# Patient Record
Sex: Female | Born: 1985 | Race: White | Hispanic: No | Marital: Married | Smoking: Former smoker
Health system: Southern US, Community
[De-identification: ages and names within clinical notes are randomized; demographics above are authoritative.]

## PROBLEM LIST (undated history)

## (undated) DIAGNOSIS — Z8619 Personal history of other infectious and parasitic diseases: Secondary | ICD-10-CM

## (undated) HISTORY — DX: Personal history of other infectious and parasitic diseases: Z86.19

## (undated) HISTORY — PX: NO PAST SURGERIES: SHX2092

---

## 2015-06-15 LAB — OB RESULTS CONSOLE ABO/RH: RH Type: POSITIVE

## 2015-06-15 LAB — OB RESULTS CONSOLE HIV ANTIBODY (ROUTINE TESTING): HIV: NONREACTIVE

## 2015-06-15 LAB — OB RESULTS CONSOLE ANTIBODY SCREEN: Antibody Screen: NEGATIVE

## 2015-06-15 LAB — OB RESULTS CONSOLE GC/CHLAMYDIA
Chlamydia: NEGATIVE
Gonorrhea: NEGATIVE

## 2015-06-15 LAB — OB RESULTS CONSOLE HEPATITIS B SURFACE ANTIGEN: HEP B S AG: NEGATIVE

## 2015-06-15 LAB — OB RESULTS CONSOLE RUBELLA ANTIBODY, IGM: RUBELLA: IMMUNE

## 2015-06-15 LAB — OB RESULTS CONSOLE RPR: RPR: NONREACTIVE

## 2015-12-31 LAB — OB RESULTS CONSOLE GBS: STREP GROUP B AG: NEGATIVE

## 2016-01-20 ENCOUNTER — Other Ambulatory Visit: Payer: Self-pay | Admitting: Obstetrics & Gynecology

## 2016-01-20 ENCOUNTER — Encounter (HOSPITAL_COMMUNITY): Payer: Self-pay | Admitting: *Deleted

## 2016-01-20 ENCOUNTER — Telehealth (HOSPITAL_COMMUNITY): Payer: Self-pay | Admitting: *Deleted

## 2016-01-20 NOTE — Telephone Encounter (Signed)
Preadmission screen  

## 2016-01-21 ENCOUNTER — Inpatient Hospital Stay (HOSPITAL_COMMUNITY): Payer: Medicaid Other | Admitting: Anesthesiology

## 2016-01-21 ENCOUNTER — Encounter (HOSPITAL_COMMUNITY)
Admission: RE | Disposition: A | Discharge status: Home / Self Care | Payer: Self-pay | Source: Ambulatory Visit | Attending: Obstetrics & Gynecology

## 2016-01-21 ENCOUNTER — Inpatient Hospital Stay (HOSPITAL_COMMUNITY)
Admission: RE | Admit: 2016-01-21 | Discharge: 2016-01-23 | DRG: 766 | Disposition: A | Discharge status: Home / Self Care | Payer: Medicaid Other | Source: Ambulatory Visit | Attending: Obstetrics & Gynecology | Admitting: Obstetrics & Gynecology

## 2016-01-21 ENCOUNTER — Encounter (HOSPITAL_COMMUNITY): Payer: Self-pay

## 2016-01-21 DIAGNOSIS — Y92231 Patient bathroom in hospital as the place of occurrence of the external cause: Secondary | ICD-10-CM | POA: Diagnosis not present

## 2016-01-21 DIAGNOSIS — O3660X0 Maternal care for excessive fetal growth, unspecified trimester, not applicable or unspecified: Secondary | ICD-10-CM | POA: Diagnosis present

## 2016-01-21 DIAGNOSIS — Z98891 History of uterine scar from previous surgery: Secondary | ICD-10-CM

## 2016-01-21 DIAGNOSIS — R55 Syncope and collapse: Secondary | ICD-10-CM | POA: Diagnosis not present

## 2016-01-21 DIAGNOSIS — W182XXA Fall in (into) shower or empty bathtub, initial encounter: Secondary | ICD-10-CM | POA: Diagnosis not present

## 2016-01-21 DIAGNOSIS — Y93F1 Activity, caregiving, bathing: Secondary | ICD-10-CM

## 2016-01-21 DIAGNOSIS — Z3A39 39 weeks gestation of pregnancy: Secondary | ICD-10-CM

## 2016-01-21 DIAGNOSIS — O3663X0 Maternal care for excessive fetal growth, third trimester, not applicable or unspecified: Principal | ICD-10-CM | POA: Diagnosis present

## 2016-01-21 DIAGNOSIS — Z87891 Personal history of nicotine dependence: Secondary | ICD-10-CM | POA: Diagnosis not present

## 2016-01-21 LAB — TYPE AND SCREEN
ABO/RH(D): O POS
ANTIBODY SCREEN: NEGATIVE

## 2016-01-21 LAB — CBC
HEMATOCRIT: 38 % (ref 36.0–46.0)
HEMOGLOBIN: 13.1 g/dL (ref 12.0–15.0)
MCH: 27.6 pg (ref 26.0–34.0)
MCHC: 34.5 g/dL (ref 30.0–36.0)
MCV: 80.2 fL (ref 78.0–100.0)
Platelets: 234 10*3/uL (ref 150–400)
RBC: 4.74 MIL/uL (ref 3.87–5.11)
RDW: 14.1 % (ref 11.5–15.5)
WBC: 13.7 10*3/uL — AB (ref 4.0–10.5)

## 2016-01-21 LAB — ABO/RH: ABO/RH(D): O POS

## 2016-01-21 SURGERY — Surgical Case
Anesthesia: Spinal

## 2016-01-21 MED ORDER — LIDOCAINE HCL (PF) 1 % IJ SOLN: 30.0000 mL | INTRAMUSCULAR | Status: DC | PRN

## 2016-01-21 MED ORDER — NALBUPHINE HCL 10 MG/ML IJ SOLN
5.0000 mg | INTRAMUSCULAR | Status: DC | PRN
  Administered 2016-01-21: 5 mg via INTRAVENOUS

## 2016-01-21 MED ORDER — PHENYLEPHRINE 8 MG IN D5W 100 ML (0.08MG/ML) PREMIX OPTIME
INJECTION | INTRAVENOUS | Status: DC | PRN
Start: 2016-01-21 — End: 2016-01-21
  Administered 2016-01-21: 60 ug/min via INTRAVENOUS

## 2016-01-21 MED ORDER — NALBUPHINE HCL 10 MG/ML IJ SOLN: 5.0000 mg | INTRAMUSCULAR | Status: DC | PRN

## 2016-01-21 MED ORDER — OXYCODONE-ACETAMINOPHEN 5-325 MG PO TABS
1.0000 | ORAL_TABLET | ORAL | Status: DC | PRN
  Administered 2016-01-22 – 2016-01-23 (×6): 1 via ORAL
  Filled 2016-01-21 (×6): qty 1

## 2016-01-21 MED ORDER — OXYTOCIN 40 UNITS IN LACTATED RINGERS INFUSION - SIMPLE MED
1.0000 m[IU]/min | INTRAVENOUS | Status: DC
  Administered 2016-01-21: 1 m[IU]/min via INTRAVENOUS

## 2016-01-21 MED ORDER — LACTATED RINGERS IV SOLN: 500.0000 mL | Freq: Once | INTRAVENOUS | Status: DC

## 2016-01-21 MED ORDER — CEFAZOLIN SODIUM-DEXTROSE 2-3 GM-% IV SOLR
INTRAVENOUS | Status: DC | PRN
  Administered 2016-01-21: 2 g via INTRAVENOUS

## 2016-01-21 MED ORDER — FENTANYL CITRATE (PF) 100 MCG/2ML IJ SOLN
50.0000 ug | INTRAMUSCULAR | Status: DC | PRN
Start: 2016-01-21 — End: 2016-01-21

## 2016-01-21 MED ORDER — OXYCODONE-ACETAMINOPHEN 5-325 MG PO TABS: 2.0000 | ORAL_TABLET | ORAL | Status: DC | PRN

## 2016-01-21 MED ORDER — LACTATED RINGERS IV SOLN
500.0000 mL | INTRAVENOUS | Status: DC | PRN
  Administered 2016-01-21: 500 mL via INTRAVENOUS

## 2016-01-21 MED ORDER — COCONUT OIL OIL: 1.0000 "application " | TOPICAL_OIL | Status: DC | PRN

## 2016-01-21 MED ORDER — FENTANYL CITRATE (PF) 100 MCG/2ML IJ SOLN
INTRAMUSCULAR | Status: DC | PRN
  Administered 2016-01-21: 10 ug via INTRATHECAL

## 2016-01-21 MED ORDER — DIPHENHYDRAMINE HCL 50 MG/ML IJ SOLN: 12.5000 mg | INTRAMUSCULAR | Status: DC | PRN

## 2016-01-21 MED ORDER — ACETAMINOPHEN 325 MG PO TABS
650.0000 mg | ORAL_TABLET | ORAL | Status: DC | PRN
  Administered 2016-01-21: 650 mg via ORAL
  Filled 2016-01-21: qty 2

## 2016-01-21 MED ORDER — FENTANYL 2.5 MCG/ML BUPIVACAINE 1/10 % EPIDURAL INFUSION (WH - ANES): 14.0000 mL/h | INTRAMUSCULAR | Status: DC | PRN

## 2016-01-21 MED ORDER — NALBUPHINE HCL 10 MG/ML IJ SOLN
INTRAMUSCULAR | Status: AC
  Filled 2016-01-21: qty 1

## 2016-01-21 MED ORDER — OXYTOCIN 40 UNITS IN LACTATED RINGERS INFUSION - SIMPLE MED: 1.0000 m[IU]/min | INTRAVENOUS | Status: DC

## 2016-01-21 MED ORDER — SENNOSIDES-DOCUSATE SODIUM 8.6-50 MG PO TABS
2.0000 | ORAL_TABLET | ORAL | Status: DC
  Administered 2016-01-21 – 2016-01-23 (×2): 2 via ORAL
  Filled 2016-01-21 (×2): qty 2

## 2016-01-21 MED ORDER — KETOROLAC TROMETHAMINE 30 MG/ML IJ SOLN
30.0000 mg | Freq: Four times a day (QID) | INTRAMUSCULAR | Status: DC | PRN
  Administered 2016-01-21: 30 mg via INTRAMUSCULAR

## 2016-01-21 MED ORDER — MORPHINE SULFATE (PF) 0.5 MG/ML IJ SOLN
INTRAMUSCULAR | Status: AC
  Filled 2016-01-21: qty 10

## 2016-01-21 MED ORDER — DIBUCAINE 1 % RE OINT: 1.0000 "application " | TOPICAL_OINTMENT | RECTAL | Status: DC | PRN

## 2016-01-21 MED ORDER — OXYTOCIN 40 UNITS IN LACTATED RINGERS INFUSION - SIMPLE MED: 2.5000 [IU]/h | INTRAVENOUS | Status: DC

## 2016-01-21 MED ORDER — NALBUPHINE HCL 10 MG/ML IJ SOLN: 5.0000 mg | Freq: Once | INTRAMUSCULAR | Status: DC | PRN

## 2016-01-21 MED ORDER — PHENYLEPHRINE 40 MCG/ML (10ML) SYRINGE FOR IV PUSH (FOR BLOOD PRESSURE SUPPORT): 80.0000 ug | PREFILLED_SYRINGE | INTRAVENOUS | Status: DC | PRN

## 2016-01-21 MED ORDER — OXYTOCIN BOLUS FROM INFUSION: 500.0000 mL | INTRAVENOUS | Status: DC

## 2016-01-21 MED ORDER — SOD CITRATE-CITRIC ACID 500-334 MG/5ML PO SOLN
30.0000 mL | ORAL | Status: DC | PRN
  Administered 2016-01-21: 30 mL via ORAL
  Filled 2016-01-21: qty 15

## 2016-01-21 MED ORDER — ONDANSETRON HCL 4 MG/2ML IJ SOLN: 4.0000 mg | Freq: Three times a day (TID) | INTRAMUSCULAR | Status: DC | PRN

## 2016-01-21 MED ORDER — MEPERIDINE HCL 25 MG/ML IJ SOLN: 6.2500 mg | INTRAMUSCULAR | Status: DC | PRN

## 2016-01-21 MED ORDER — METOCLOPRAMIDE HCL 5 MG/ML IJ SOLN
INTRAMUSCULAR | Status: AC
  Administered 2016-01-21: 10 mg via INTRAVENOUS
  Filled 2016-01-21: qty 2

## 2016-01-21 MED ORDER — FLEET ENEMA 7-19 GM/118ML RE ENEM: 1.0000 | ENEMA | RECTAL | Status: DC | PRN

## 2016-01-21 MED ORDER — SIMETHICONE 80 MG PO CHEW
80.0000 mg | CHEWABLE_TABLET | ORAL | Status: DC
  Administered 2016-01-21 – 2016-01-23 (×2): 80 mg via ORAL
  Filled 2016-01-21 (×2): qty 1

## 2016-01-21 MED ORDER — MORPHINE SULFATE (PF) 0.5 MG/ML IJ SOLN
INTRAMUSCULAR | Status: DC | PRN
  Administered 2016-01-21: .2 mg via INTRATHECAL

## 2016-01-21 MED ORDER — TERBUTALINE SULFATE 1 MG/ML IJ SOLN: 0.2500 mg | Freq: Once | INTRAMUSCULAR | Status: DC | PRN

## 2016-01-21 MED ORDER — SIMETHICONE 80 MG PO CHEW
80.0000 mg | CHEWABLE_TABLET | ORAL | Status: DC | PRN
  Administered 2016-01-21: 80 mg via ORAL
  Filled 2016-01-21: qty 1

## 2016-01-21 MED ORDER — OXYTOCIN 10 UNIT/ML IJ SOLN
40.0000 [IU] | INTRAVENOUS | Status: DC | PRN
  Administered 2016-01-21: 40 [IU] via INTRAVENOUS

## 2016-01-21 MED ORDER — PHENYLEPHRINE HCL 10 MG/ML IJ SOLN
INTRAMUSCULAR | Status: DC | PRN
  Administered 2016-01-21: 40 ug via INTRAVENOUS
  Administered 2016-01-21 (×2): 80 ug via INTRAVENOUS

## 2016-01-21 MED ORDER — PHENYLEPHRINE 8 MG IN D5W 100 ML (0.08MG/ML) PREMIX OPTIME
INJECTION | INTRAVENOUS | Status: AC
  Filled 2016-01-21: qty 100

## 2016-01-21 MED ORDER — ONDANSETRON HCL 4 MG/2ML IJ SOLN
INTRAMUSCULAR | Status: DC | PRN
  Administered 2016-01-21: 4 mg via INTRAVENOUS

## 2016-01-21 MED ORDER — KETOROLAC TROMETHAMINE 30 MG/ML IJ SOLN: 30.0000 mg | Freq: Four times a day (QID) | INTRAMUSCULAR | Status: DC | PRN

## 2016-01-21 MED ORDER — ZOLPIDEM TARTRATE 5 MG PO TABS: 5.0000 mg | ORAL_TABLET | Freq: Every evening | ORAL | Status: DC | PRN

## 2016-01-21 MED ORDER — MISOPROSTOL 25 MCG QUARTER TABLET
25.0000 ug | ORAL_TABLET | ORAL | Status: DC | PRN
  Filled 2016-01-21 (×2): qty 0.25

## 2016-01-21 MED ORDER — NALOXONE HCL 0.4 MG/ML IJ SOLN: 0.4000 mg | INTRAMUSCULAR | Status: DC | PRN

## 2016-01-21 MED ORDER — DIPHENHYDRAMINE HCL 25 MG PO CAPS
25.0000 mg | ORAL_CAPSULE | ORAL | Status: DC | PRN
  Filled 2016-01-21: qty 1

## 2016-01-21 MED ORDER — OXYTOCIN 40 UNITS IN LACTATED RINGERS INFUSION - SIMPLE MED: 2.5000 [IU]/h | INTRAVENOUS | Status: AC

## 2016-01-21 MED ORDER — PHENYLEPHRINE 40 MCG/ML (10ML) SYRINGE FOR IV PUSH (FOR BLOOD PRESSURE SUPPORT)
80.0000 ug | PREFILLED_SYRINGE | INTRAVENOUS | Status: DC | PRN
Start: 2016-01-21 — End: 2016-01-21

## 2016-01-21 MED ORDER — SIMETHICONE 80 MG PO CHEW
80.0000 mg | CHEWABLE_TABLET | Freq: Three times a day (TID) | ORAL | Status: DC
  Administered 2016-01-22 – 2016-01-23 (×5): 80 mg via ORAL
  Filled 2016-01-21 (×5): qty 1

## 2016-01-21 MED ORDER — TETANUS-DIPHTH-ACELL PERTUSSIS 5-2.5-18.5 LF-MCG/0.5 IM SUSP: 0.5000 mL | Freq: Once | INTRAMUSCULAR | Status: DC

## 2016-01-21 MED ORDER — SODIUM CHLORIDE 0.9% FLUSH: 3.0000 mL | INTRAVENOUS | Status: DC | PRN

## 2016-01-21 MED ORDER — FENTANYL CITRATE (PF) 100 MCG/2ML IJ SOLN
INTRAMUSCULAR | Status: AC
  Filled 2016-01-21: qty 2

## 2016-01-21 MED ORDER — ONDANSETRON HCL 4 MG/2ML IJ SOLN
INTRAMUSCULAR | Status: AC
  Filled 2016-01-21: qty 2

## 2016-01-21 MED ORDER — CEFAZOLIN SODIUM-DEXTROSE 2-4 GM/100ML-% IV SOLN
INTRAVENOUS | Status: AC
  Filled 2016-01-21: qty 100

## 2016-01-21 MED ORDER — EPHEDRINE 5 MG/ML INJ: 10.0000 mg | INTRAVENOUS | Status: DC | PRN

## 2016-01-21 MED ORDER — LACTATED RINGERS IV SOLN
INTRAVENOUS | Status: DC
  Administered 2016-01-21 – 2016-01-22 (×2): via INTRAVENOUS

## 2016-01-21 MED ORDER — KETOROLAC TROMETHAMINE 30 MG/ML IJ SOLN
INTRAMUSCULAR | Status: AC
  Administered 2016-01-21: 30 mg via INTRAMUSCULAR
  Filled 2016-01-21: qty 1

## 2016-01-21 MED ORDER — BUPIVACAINE IN DEXTROSE 0.75-8.25 % IT SOLN
INTRATHECAL | Status: DC | PRN
  Administered 2016-01-21: 12 mg via INTRATHECAL

## 2016-01-21 MED ORDER — SCOPOLAMINE 1 MG/3DAYS TD PT72
1.0000 | MEDICATED_PATCH | Freq: Once | TRANSDERMAL | Status: DC
  Administered 2016-01-21: 1.5 mg via TRANSDERMAL

## 2016-01-21 MED ORDER — LACTATED RINGERS IV SOLN
INTRAVENOUS | Status: DC
  Administered 2016-01-21: 01:00:00 via INTRAVENOUS
  Administered 2016-01-21: 125 mL/h via INTRAVENOUS
  Administered 2016-01-21 (×2): via INTRAVENOUS

## 2016-01-21 MED ORDER — DIPHENHYDRAMINE HCL 25 MG PO CAPS
25.0000 mg | ORAL_CAPSULE | Freq: Four times a day (QID) | ORAL | Status: DC | PRN
  Administered 2016-01-21: 25 mg via ORAL
  Filled 2016-01-21 (×2): qty 1

## 2016-01-21 MED ORDER — METOCLOPRAMIDE HCL 5 MG/ML IJ SOLN
10.0000 mg | Freq: Once | INTRAMUSCULAR | Status: AC
  Administered 2016-01-21: 10 mg via INTRAVENOUS

## 2016-01-21 MED ORDER — ONDANSETRON HCL 4 MG/2ML IJ SOLN: 4.0000 mg | Freq: Four times a day (QID) | INTRAMUSCULAR | Status: DC | PRN

## 2016-01-21 MED ORDER — OXYTOCIN 40 UNITS IN LACTATED RINGERS INFUSION - SIMPLE MED
1.0000 m[IU]/min | INTRAVENOUS | Status: DC
  Filled 2016-01-21: qty 1000

## 2016-01-21 MED ORDER — SCOPOLAMINE 1 MG/3DAYS TD PT72
MEDICATED_PATCH | TRANSDERMAL | Status: AC
  Filled 2016-01-21: qty 1

## 2016-01-21 MED ORDER — MENTHOL 3 MG MT LOZG: 1.0000 | LOZENGE | OROMUCOSAL | Status: DC | PRN

## 2016-01-21 MED ORDER — OXYTOCIN 10 UNIT/ML IJ SOLN
INTRAMUSCULAR | Status: AC
  Filled 2016-01-21: qty 4

## 2016-01-21 MED ORDER — NALOXONE HCL 2 MG/2ML IJ SOSY
1.0000 ug/kg/h | PREFILLED_SYRINGE | INTRAVENOUS | Status: DC | PRN
  Filled 2016-01-21: qty 2

## 2016-01-21 MED ORDER — PRENATAL MULTIVITAMIN CH
1.0000 | ORAL_TABLET | Freq: Every day | ORAL | Status: DC
  Administered 2016-01-22 – 2016-01-23 (×2): 1 via ORAL
  Filled 2016-01-21 (×2): qty 1

## 2016-01-21 MED ORDER — ACETAMINOPHEN 325 MG PO TABS: 650.0000 mg | ORAL_TABLET | ORAL | Status: DC | PRN

## 2016-01-21 MED ORDER — WITCH HAZEL-GLYCERIN EX PADS: 1.0000 "application " | MEDICATED_PAD | CUTANEOUS | Status: DC | PRN

## 2016-01-21 MED ORDER — LACTATED RINGERS IV SOLN
INTRAVENOUS | Status: DC | PRN
  Administered 2016-01-21: 13:00:00 via INTRAVENOUS

## 2016-01-21 MED ORDER — IBUPROFEN 600 MG PO TABS
600.0000 mg | ORAL_TABLET | Freq: Four times a day (QID) | ORAL | Status: DC
  Administered 2016-01-21 – 2016-01-23 (×8): 600 mg via ORAL
  Filled 2016-01-21 (×8): qty 1

## 2016-01-21 SURGICAL SUPPLY — 40 items
BENZOIN TINCTURE PRP APPL 2/3 (GAUZE/BANDAGES/DRESSINGS) ×3 IMPLANT
CLAMP CORD UMBIL (MISCELLANEOUS) IMPLANT
CLOSURE WOUND 1/2 X4 (GAUZE/BANDAGES/DRESSINGS) ×1
CLOTH BEACON ORANGE TIMEOUT ST (SAFETY) ×3 IMPLANT
CONTAINER PREFILL 10% NBF 15ML (MISCELLANEOUS) IMPLANT
DRSG OPSITE POSTOP 4X10 (GAUZE/BANDAGES/DRESSINGS) ×3 IMPLANT
DURAPREP 26ML APPLICATOR (WOUND CARE) ×3 IMPLANT
ELECT REM PT RETURN 9FT ADLT (ELECTROSURGICAL) ×3
ELECTRODE REM PT RTRN 9FT ADLT (ELECTROSURGICAL) ×1 IMPLANT
EXTRACTOR VACUUM M CUP 4 TUBE (SUCTIONS) IMPLANT
EXTRACTOR VACUUM M CUP 4' TUBE (SUCTIONS)
GLOVE BIO SURGEON STRL SZ7.5 (GLOVE) ×3 IMPLANT
GLOVE BIOGEL PI IND STRL 7.0 (GLOVE) ×1 IMPLANT
GLOVE BIOGEL PI IND STRL 7.5 (GLOVE) ×1 IMPLANT
GLOVE BIOGEL PI INDICATOR 7.0 (GLOVE) ×2
GLOVE BIOGEL PI INDICATOR 7.5 (GLOVE) ×2
GOWN STRL REUS W/TWL LRG LVL3 (GOWN DISPOSABLE) ×6 IMPLANT
KIT ABG SYR 3ML LUER SLIP (SYRINGE) IMPLANT
NEEDLE HYPO 25X5/8 SAFETYGLIDE (NEEDLE) IMPLANT
NS IRRIG 1000ML POUR BTL (IV SOLUTION) ×3 IMPLANT
PACK C SECTION WH (CUSTOM PROCEDURE TRAY) ×3 IMPLANT
PAD ABD 7.5X8 STRL (GAUZE/BANDAGES/DRESSINGS) ×3 IMPLANT
PAD ABD 8X10 STRL (GAUZE/BANDAGES/DRESSINGS) ×3 IMPLANT
PAD OB MATERNITY 4.3X12.25 (PERSONAL CARE ITEMS) ×3 IMPLANT
PENCIL SMOKE EVAC W/HOLSTER (ELECTROSURGICAL) ×3 IMPLANT
RTRCTR C-SECT PINK 25CM LRG (MISCELLANEOUS) ×3 IMPLANT
SPONGE GAUZE 4X4 12PLY (GAUZE/BANDAGES/DRESSINGS) ×3 IMPLANT
SPONGE GAUZE 4X4 12PLY STER LF (GAUZE/BANDAGES/DRESSINGS) ×6 IMPLANT
SPONGE LAP 18X18 X RAY DECT (DISPOSABLE) ×6 IMPLANT
STRIP CLOSURE SKIN 1/2X4 (GAUZE/BANDAGES/DRESSINGS) ×2 IMPLANT
SUT CHROMIC 2 0 CT 1 (SUTURE) ×3 IMPLANT
SUT MNCRL AB 3-0 PS2 27 (SUTURE) ×3 IMPLANT
SUT PLAIN 2 0 XLH (SUTURE) ×6 IMPLANT
SUT VIC AB 0 CT1 36 (SUTURE) ×3 IMPLANT
SUT VIC AB 0 CTX 36 (SUTURE) ×6
SUT VIC AB 0 CTX36XBRD ANBCTRL (SUTURE) ×3 IMPLANT
SUT VIC AB 2-0 SH 27 (SUTURE) ×4
SUT VIC AB 2-0 SH 27XBRD (SUTURE) ×2 IMPLANT
TOWEL OR 17X24 6PK STRL BLUE (TOWEL DISPOSABLE) ×3 IMPLANT
TRAY FOLEY CATH SILVER 14FR (SET/KITS/TRAYS/PACK) ×3 IMPLANT

## 2016-01-21 NOTE — Anesthesia Pain Management Evaluation Note (Signed)
  CRNA Pain Management Visit Note  Patient: Samantha Durham, 30 y.o., female  "Hello I am a member of the anesthesia team at [facility]. We have an anesthesia team available at all times to provide care throughout the hospital, including epidural management and anesthesia for C-section. I don't know your plan for the delivery whether it a natural birth, water birth, IV sedation, nitrous supplementation, doula or epidural, but we want to meet your pain goals."   1.Was your pain managed to your expectations on prior hospitalizations?   epidural  2.What is your expectation for pain management during this hospitalization?     epidural  3.How can we help you reach that goal? epidural  Record the patient's initial score and the patient's pain goal.   Pain: 2/10  Pain Goal: 2/10 [facility] wants you to be able to say your pain was always managed very well.  Samantha Durham, Samantha Durham 01/21/2016

## 2016-01-21 NOTE — Progress Notes (Signed)
Samantha Durham is a 30 y.o. G3P1011 at 6844w3d admitted for elective induction with a history of shoulder dystocia and 10-3 baby  Subjective:  Comfortable with  Labor support without medications Contractions every 2-3 minutes, lasting 60 seconds, intensity 4/10    Objective: BP 97/60 mmHg  Pulse 96  Temp(Src) 98.5 F (36.9 C) (Oral)  Resp 18  Ht 5\' 6"  (1.676 m)  Wt 214 lb (97.07 kg)  BMI 34.56 kg/m2  LMP 04/20/2015      FHT: Category 1, variability: moderate,  accelerations:  Present,  decelerations:  Absent SVE:   Dilation: 1 Effacement (%): 50 Station: -3 Exam by:: Clemmons  Labs: Lab Results  Component Value Date   WBC 13.7* 01/21/2016   HGB 13.1 01/21/2016   HCT 38.0 01/21/2016   MCV 80.2 01/21/2016   PLT 234 01/21/2016    Assessment / Plan: IUP @ 39+3 Elective induction Pitocin Fetal Wellbeing: reassuring Anticipated MOD:  NSVD  Lori A Clemmons 01/21/2016, 7:31 AM

## 2016-01-21 NOTE — Anesthesia Procedure Notes (Signed)
Spinal  Staffing Anesthesiologist: Marcene DuosFITZGERALD, Avary Eichenberger Performed by: anesthesiologist  Preanesthetic Checklist Completed: patient identified, site marked, surgical consent, pre-op evaluation, timeout performed, IV checked, risks and benefits discussed and monitors and equipment checked Spinal Block Patient position: sitting Prep: site prepped and draped and DuraPrep Patient monitoring: heart rate, continuous pulse ox and blood pressure Approach: midline Location: L4-5 Injection technique: single-shot Needle Needle type: Pencan  Needle gauge: 24 G Needle length: 9 cm Assessment Sensory level: T6

## 2016-01-21 NOTE — Op Note (Signed)
Cesarean Section Procedure Note  Indications: P1 at 3439 3/7wks with LGA and h/o c-section electing for c-section.  Pre-operative Diagnosis: cesarean section - LGA, hx shoulder dystocia   Post-operative Diagnosis: cesarean section - LGA, hx shoulder dystocia  Procedure: PRIMARY LOW TRANSVERSE CESAREAN SECTION  Surgeon: Osborn CohoAngela Rabia Argote, MD    Assistants: Webb SilversmithHeather Krietemeyer, RNFA  Anesthesia: Regional  Procedure Details  The patient was taken [facility] the operating [facility] after the risks, benefits, complications, treatment options, and expected outcomes were discussed with the patient.  The patient concurred with the proposed plan, giving informed consent which was signed and witnessed. The patient was taken to Operating Room C-Section Suite, identified as Samantha Arvilla MarketMichelle Durham and the procedure verified as C-Section Delivery. A Time Out was held and the above information confirmed.  After induction of anesthesia by obtaining a spinal, the patient was prepped and draped in the usual sterile manner. A Pfannenstiel skin incision was made and carried down through the subcutaneous tissue to the underlying layer of fascia.  The fascia was incised bilaterally and extended transversely bilaterally with the Mayo scissors. Kocher clamps were placed on the inferior aspect of the fascial incision and the underlying rectus muscle was separated from the fascia. The same was done on the superior aspect of the fascial incision.  The peritoneum was identified, entered bluntly and extended manually.  An Alexis self-retaining retractor was placed.  The utero-vesical peritoneal reflection was incised transversely and the bladder flap was bluntly freed from the lower uterine segment. A low transverse uterine incision was made with the scalpel and extended bilaterally with the bandage scissors.  The infant was delivered in vertex position without difficulty.  After the umbilical cord was  clamped and cut, the infant was handed to the awaiting pediatricians.  Cord blood was obtained for evaluation.  The placenta was removed intact and appeared to be within normal limits. The uterus was cleared of all clots and debris. The uterine incision was closed with running interlocking sutures of 0 Vicryl and a second imbricating layer was performed as well.   Bilateral tubes and ovaries appeared to be within normal limits.  Good hemostasis was noted.  Copious irrigation was performed until clear.  The peritoneum was repaired with 2-0 chromic via a running suture.  The fascia was reapproximated with a running suture of 0 Vicryl. The subcutaneous tissue was reapproximated with 3 interrupted sutures of 2-0 plain.  The skin was reapproximated with a subcuticular suture of 3-0 monocryl.  Steristrips were applied.  Instrument, sponge, and needle counts were correct prior to abdominal closure and at the conclusion of the case.  The patient was awaiting transfer to the recovery room in good condition.  Findings: Live female infant with Apgars 9 at one minute and 9 at five minutes.  Normal appearing bilateral ovaries and fallopian tubes were noted.  Estimated Blood Loss:  1300 ml         Drains: foley to gravity          Total IV Fluids: 3300 ml         Specimens to Pathology: Placenta         Complications:  None; patient tolerated the procedure well.         Disposition: PACU - hemodynamically stable.         Condition: stable  Attending Attestation: I performed the procedure.

## 2016-01-21 NOTE — H&P (Signed)
Samantha Durham is a 30 y.o. female presenting for elective induction of labor; Current pregnancy- LGA 94.2% 9 lbs 2 oz. In prior pregnancy there was a  previous shoulder dystocia with a 10 lb 3 oz baby.   Pregnancy followed at [facility] since 8 weeks and remarkable for: history of a 10 lb 3 oz baby with difficult birth and lga with current pregnancy    OB History    Gravida Para Term Preterm AB TAB SAB Ectopic Multiple Living   3 1 1  1  1   1      Past Medical History  Diagnosis Date  . Hx of varicella    Past Surgical History  Procedure Laterality Date  . No past surgeries      Family History:   family history is not on file. Social History:    reports that she quit smoking about 4 years ago. She has never used smokeless tobacco. She reports that she does not drink alcohol or use illicit drugs.   Prenatal labs: ABO, Rh: O/Positive/-- (11/22 0000) Antibody: Negative (11/22 0000) Rubella: !Error!Immune RPR: Nonreactive (11/22 0000)  HBsAg: Negative (11/22 0000)  HIV: Non-reactive (11/22 0000)  GBS: Negative (06/09 0000)    Prenatal Transfer Tool  Maternal Diabetes: No Genetic Screening: Normal Maternal Ultrasounds/Referrals: Normal Fetal Ultrasounds or other Referrals:  None Maternal Substance Abuse:  No Significant Maternal Medications:  None Significant Maternal Lab Results: None   Dilation: 1 Effacement (%): 50 Station: -3 Exam by:: S Moyer Blood pressure 117/60, pulse 100, temperature 98.7 F (37.1 C), temperature source Oral, resp. rate 16, height 5\' 6"  (1.676 m), weight 214 lb (97.07 kg), last menstrual period 04/20/2015.  General Appearance: Alert, appropriate appearance for age. No acute distress HEENT Exam: Grossly normal Chest/Respiratory Exam: Normal chest wall and respirations. Clear to auscultation Cardiovascular Exam: Regular rate and rhythm. S1, S2, no murmur Gastrointestinal Exam: soft, non-tender, Uterus gravid with size compatible with  GA, Vertex presentation by Leopold's maneuvers Psychiatric Exam: Alert and oriented, appropriate affect    Vaginal exam: Dilation: 1 Effacement (%): 50 Cervical Position: Anterior Station: -3 Presentation: Vertex Exam by:: Clemmons  Fetal tracings: Category 1 , reassuring,  Assessment/Plan:  IUP @ 39+3 Elective Induction of labor; Hx of 10 lb 3 oz baby with shoulder dystocia GBS negative  Low dose pitocin Will place foley Bulb when cervix is in better position Anticipate vaginal delivery    Illene BolusLori Clemmons CNM  01/21/2016, 2:13 AM  I agree with above findings, assessment and plan.  At the office all delivery options were discussed with the patient given her history of LGA baby and shoulder dystocia in last pregnancy.  Options of expectant management and awaiting spontaneous labor, elective labor induction and cesarean delivery were discussed.  Patient desired an elective induction of labor.  We discussed induction plan at the office as well as risks of labor induction that include higher risk of cesarean delivery.  We discussed potential for repeat shoulder dystocia and what the management plan would be in that situation.  All questions were answered and patient agreed to come in for an elective induction.  Dr. Sallye OberKulwa.

## 2016-01-21 NOTE — Transfer of Care (Signed)
Immediate Anesthesia Transfer of Care Note  Patient: Samantha Durham  Procedure(s) Performed: Procedure(s): CESAREAN SECTION (N/A)  Patient Location: PACU  Anesthesia Type:Spinal  Level of Consciousness: awake  Airway & Oxygen Therapy: Patient Spontanous Breathing  Post-op Assessment: Report given to RN  Post vital signs: Reviewed and stable  Last Vitals:  Filed Vitals:   01/21/16 1130 01/21/16 1200  BP: 111/70 98/57  Pulse: 87 93  Temp:    Resp: 16 16    Last Pain:  Filed Vitals:   01/21/16 1223  PainSc: 0-No pain      Patients Stated Pain Goal: 8 (01/21/16 0057)  Complications: No apparent anesthesia complications

## 2016-01-21 NOTE — Progress Notes (Signed)
Samantha Durham is a 30 y.o. G3P1011 at 703w3d admitted for induction of labor due to term and h/o 10lb 3oz baby with SD.  Subjective: No complaints  Objective: BP 99/66 mmHg  Pulse 85  Temp(Src) 98 F (36.7 C) (Oral)  Resp 16  Ht 5\' 6"  (1.676 m)  Wt 214 lb (97.07 kg)  BMI 34.56 kg/m2  LMP 04/20/2015      FHT:  FHR: 130s bpm, variability: moderate,  accelerations:  Present,  decelerations:  Absent UC:   regular, every 2-3 minutes SVE:   Dilation: 1 Effacement (%): 50 Station: -3 Exam by:: Clemmons  Labs: Lab Results  Component Value Date   WBC 13.7* 01/21/2016   HGB 13.1 01/21/2016   HCT 38.0 01/21/2016   MCV 80.2 01/21/2016   PLT 234 01/21/2016    Assessment / Plan: Induction of labor,  progressing well on pitocin  Labor: Progressing on Pitocin, will continue to increase then AROM Preeclampsia:  no signs or symptoms of toxicity Fetal Wellbeing:  Category I Pain Control:  pain medicine upon request I/D:  GBS neg Anticipated MOD:  NSVD  Samantha Durham Y 01/21/2016, 11:04 AM

## 2016-01-21 NOTE — Anesthesia Postprocedure Evaluation (Signed)
Anesthesia Post Note  Patient: Samantha Durham  Procedure(s) Performed: Procedure(s) (LRB): CESAREAN SECTION (N/A)  Patient location during evaluation: PACU Anesthesia Type: Spinal Level of consciousness: awake and alert Pain management: pain level controlled Vital Signs Assessment: post-procedure vital signs reviewed and stable Respiratory status: spontaneous breathing and respiratory function stable Cardiovascular status: blood pressure returned to baseline and stable Postop Assessment: spinal receding Anesthetic complications: no     Last Vitals:  Filed Vitals:   01/21/16 1516 01/21/16 1535  BP:  90/51  Pulse: 81 73  Temp:  36.3 C  Resp: 18 18    Last Pain:  Filed Vitals:   01/21/16 1546  PainSc: 0-No pain   Pain Goal: Patients Stated Pain Goal: 8 (01/21/16 0057)               Kennieth RadFitzgerald, Junius Faucett E

## 2016-01-21 NOTE — Anesthesia Preprocedure Evaluation (Addendum)
Anesthesia Evaluation  Patient identified by MRN, date of birth, ID band Patient awake    Reviewed: Allergy & Precautions, NPO status , Patient's Chart, lab work & pertinent test results  Airway Mallampati: II  TM Distance: >3 FB Neck ROM: Full    Dental   Pulmonary former smoker,    Pulmonary exam normal        Cardiovascular negative cardio ROS Normal cardiovascular exam     Neuro/Psych negative neurological ROS     GI/Hepatic negative GI ROS, Neg liver ROS,   Endo/Other  negative endocrine ROS  Renal/GU negative Renal ROS     Musculoskeletal   Abdominal   Peds  Hematology negative hematology ROS (+)   Anesthesia Other Findings   Reproductive/Obstetrics (+) Pregnancy                            Lab Results  Component Value Date   WBC 13.7* 01/21/2016   HGB 13.1 01/21/2016   HCT 38.0 01/21/2016   MCV 80.2 01/21/2016   PLT 234 01/21/2016   No results found for: CREATININE, BUN, NA, K, CL, CO2  Anesthesia Physical Anesthesia Plan  ASA: II  Anesthesia Plan: Spinal   Post-op Pain Management:    Induction:   Airway Management Planned: Natural Airway  Additional Equipment:   Intra-op Plan:   Post-operative Plan:   Informed Consent: I have reviewed the patients History and Physical, chart, labs and discussed the procedure including the risks, benefits and alternatives for the proposed anesthesia with the patient or authorized representative who has indicated his/her understanding and acceptance.     Plan Discussed with: CRNA  Anesthesia Plan Comments:         Anesthesia Quick Evaluation

## 2016-01-21 NOTE — Progress Notes (Signed)
Jason Arvilla MarketMichelle Bartell is a 30 y.o. G3P1011 at 5578w3d admitted for LGA and hx of shoulder dystocia with 10 lb 3 oz baby. Pt is having uncomfortable contractions when admitted.  Subjective:  Comfortable with  Labor support without medications Contractions every 2-3 minutes, lasting 60 seconds, intensity 4/10    Objective: BP 117/60 mmHg  Pulse 100  Temp(Src) 98.7 F (37.1 C) (Oral)  Resp 16  Ht 5\' 6"  (1.676 m)  Wt 214 lb (97.07 kg)  BMI 34.56 kg/m2  LMP 04/20/2015      FHT:  FHR:  bpm, variability: moderate,  accelerations:  Present,  decelerations:  Absent SVE:   Dilation: 1 Effacement (%): 50 Station: -3 Exam by:: Clemmons  Labs: Lab Results  Component Value Date   WBC 13.7* 01/21/2016   HGB 13.1 01/21/2016   HCT 38.0 01/21/2016   MCV 80.2 01/21/2016   PLT 234 01/21/2016    Assessment / Plan: Induction of labor; Unable to place cytotec due to contractions; unable to place foley bulb due to position of anterior cervix; Will start low dose pitocin Fetal Wellbeing: reassuring Anticipated MOD:  NSVD  Lori A Clemmons 01/21/2016, 3:09 AM

## 2016-01-21 NOTE — Progress Notes (Addendum)
Samantha Durham is a 30 y.o. G3P1011 at 2625w3d admitted for Induction  Subjective: Pt is very concerned that the exam has not changed and head is high.  She was anticipating a c-section most of pregnancy until ultrasound a couple of days ago and baby was 9lbs 3oz.  Pt is interested in c-section again.  Objective: BP 98/57 mmHg  Pulse 93  Temp(Src) 98 F (36.7 C) (Oral)  Resp 16  Ht 5\' 6"  (1.676 m)  Wt 214 lb (97.07 kg)  BMI 34.56 kg/m2  LMP 04/20/2015      FHT:  FHR: 140s bpm, variability: moderate,  accelerations:  Present,  decelerations:  Absent UC:   regular, every 2-3 minutes SVE:   Dilation: 2.5 Effacement (%): 50 Station: -3 Exam by:: MD Su Hiltoberts  Labs: Lab Results  Component Value Date   WBC 13.7* 01/21/2016   HGB 13.1 01/21/2016   HCT 38.0 01/21/2016   MCV 80.2 01/21/2016   PLT 234 01/21/2016    Assessment / Plan: h/o Shoulder Dystocia and currently not progressing.  Lengthy discussion with patient and her husband explaining options, risks, benefits and alternatives and patient would like to proceed with c-section.  R/B/A reviewed, questions answered and consent s/w.  Labor: proceeding with c-section Preeclampsia:  no signs or symptoms of toxicity Fetal Wellbeing:  Category I Pain Control:  per anesthesia for c-section I/D:  GBS neg Anticipated MOD:  c-section secondary to LGA  Jabir Dahlem Y 01/21/2016, 12:38 PM

## 2016-01-22 ENCOUNTER — Encounter (HOSPITAL_COMMUNITY): Payer: Self-pay | Admitting: Obstetrics and Gynecology

## 2016-01-22 DIAGNOSIS — Z98891 History of uterine scar from previous surgery: Secondary | ICD-10-CM

## 2016-01-22 LAB — CBC
HCT: 30 % — ABNORMAL LOW (ref 36.0–46.0)
HEMATOCRIT: 30.4 % — AB (ref 36.0–46.0)
HEMOGLOBIN: 10.2 g/dL — AB (ref 12.0–15.0)
HEMOGLOBIN: 10.1 g/dL — AB (ref 12.0–15.0)
MCH: 27.8 pg (ref 26.0–34.0)
MCH: 27.8 pg (ref 26.0–34.0)
MCHC: 33.9 g/dL (ref 30.0–36.0)
MCHC: 33.7 g/dL (ref 30.0–36.0)
MCV: 82.2 fL (ref 78.0–100.0)
MCV: 82.6 fL (ref 78.0–100.0)
PLATELETS: 209 10*3/uL (ref 150–400)
PLATELETS: 225 10*3/uL (ref 150–400)
RBC: 3.7 MIL/uL — AB (ref 3.87–5.11)
RBC: 3.63 MIL/uL — ABNORMAL LOW (ref 3.87–5.11)
RDW: 14.3 % (ref 11.5–15.5)
RDW: 14.5 % (ref 11.5–15.5)
WBC: 13 10*3/uL — AB (ref 4.0–10.5)
WBC: 11.3 10*3/uL — ABNORMAL HIGH (ref 4.0–10.5)

## 2016-01-22 LAB — RPR: RPR Ser Ql: NONREACTIVE

## 2016-01-22 NOTE — Progress Notes (Signed)
In to discuss circumcision.  Pt comfortable.  Has not ambulated much yet.  +Flatus.  VSS Gen: NAD Abd:  Pressure Dressing clean and dry Lochia min-moderate blood on pad Ext:  Mild edema, no calf tenderness  A/p S/p Repeat LTCS Doing well post op. After counseling, pt will likely have circumcision outpatient. Continue routine post op care. Encouraged ambulation TID.

## 2016-01-22 NOTE — Addendum Note (Signed)
Addendum  created 01/22/16 0909 by Graciela HusbandsWynn O Luverne Farone, CRNA   Modules edited: Notes Section   Notes Section:  File: [id number]

## 2016-01-22 NOTE — Progress Notes (Addendum)
   01/22/16 1600  What Happened  Was fall witnessed? No  Was patient injured? No  Patient found on floor;in bathroom  Found by Staff-comment  Stated prior activity bathroom-unassisted (on toilet, syncopal episode)  Follow Up  MD notified 1558  Time MD notified [id number]  Family notified Yes-comment (husband called from room)  Time family notified 1550  Additional tests Yes-comment (cbc)  Simple treatment Other (comment) (return to bed)  Adult Fall Risk Assessment  Risk Factor Category (scoring not indicated) High fall risk per protocol (document High fall risk)  Age 30  Fall History: Fall within 6 months prior to admission 0  Elimination; Bowel and/or Urine Incontinence 0  Elimination; Bowel and/or Urine Urgency/Frequency 0  Medications: includes PCA/Opiates, Anti-convulsants, Anti-hypertensives, Diuretics, Hypnotics, Laxatives, Sedatives, and Psychotropics 3  Patient Care Equipment 0  Mobility-Assistance 2 (previously moved well without assistance)  Mobility-Gait 0  Mobility-Sensory Deficit 0  Altered awareness of immediate physical environment 0  Impulsiveness 0  Lack of understanding of one's physical/cognitive limitations 0  Total Score 5  Patient's Fall Risk High Fall Risk (>13 points)  Adult Fall Risk Interventions  Required Bundle Interventions *See Row Information* High fall risk - low, moderate, and high requirements implemented  Additional Interventions Family Supervision;Other (Comment) (asssistance for ambulation )  Screening for Fall Injury Risk  Risk For Fall Injury- See Row Information  Nurse judgement  Injury Prevention Interventions Family supervision (low bed)  Vitals  Temp 98 F (36.7 C)  BP (!) 98/57 mmHg  BP Location Right Arm  BP Method Automatic  Patient Position (if appropriate) Lying  Pulse Rate 80  Pulse Rate Source Dinamap  Resp 18  Pain Assessment  Pain Score 5  Pain Location Incision  Pain Orientation Lower  Pain Descriptors / Indicators  Sore;Burning  Pain Frequency Intermittent  Pain Onset On-going  Patients Stated Pain Goal 5  Pain Intervention(s) Medication (See eMAR)  PCA/Epidural/Spinal Assessment  Respiratory Pattern Regular  Epidural Specific Assessment  Epidural Side Effect Assessment None  Color/Movement/Sensation (CMS) Normal  Neurological  Neuro (WDL) WDL  Musculoskeletal  Musculoskeletal (WDL) WDL  Integumentary  Integumentary (WDL) WDL  Pt stated she was in the shower and started to take off pressure bandage. States this made her light headed. She sat down on toilet with husband's assistance. Husband left pt alone and called nurse on callbell. When he hung up he heard the pt hit the ground. He found her on the floor and pulled the bath emergency cord. RN & NT immediately in room. Assisted pt to bed with wheelchair. Vital signs taken and MD notified. Pt states she hit the side of her head on wall. No pain reported or sign of injury.

## 2016-01-22 NOTE — Progress Notes (Signed)
Samantha Durham [id number]  Subjective: Postpartum Day 1: Primary LTC/S due to LGA, hx shoulder dystocia Up to BR for ambulation trial--tolerated well. Foley still in at present. Feeding:  Bottle Contraceptive plan:  Unecided at present.  Per Dr. Su Hiltoberts, increased bleeding with C/S, 1300 cc estimated by anesthesia.  Circumcision plan: Desires inpatient.  Objective: Temp:  [97.3 F (36.3 C)-98.5 F (36.9 C)] 98.2 F (36.8 C) (07/01 0645) Pulse Rate:  [66-122] 87 (07/01 0645) Resp:  [13-21] 16 (07/01 0645) BP: (90-114)/(47-72) 95/54 mmHg (07/01 0645) SpO2:  [95 %-100 %] 97 % (07/01 0645)  CBC Latest Ref Rng 01/22/2016 01/21/2016  WBC 4.0 - 10.5 K/uL 13.0(H) 13.7(H)  Hemoglobin 12.0 - 15.0 g/dL 10.2(L) 13.1  Hematocrit 36.0 - 46.0 % 30.4(L) 38.0  Platelets 150 - 400 K/uL 209 234      Physical Exam:  General: alert Lochia: appropriate Uterine Fundus: firm Abdomen:  + bowel sounds Incision: Pressure dressing CDI DVT Evaluation: No evidence of DVT seen on physical exam. Negative Homan's sign.   Assessment/Plan: Status post cesarean delivery, day 1--LGA, hx shoulder dystocia. Stable Continue current care. Dr. Dion BodyVarnado will determine plan for circumcision.    Nigel BridgemanLATHAM, Lashonne Shull MSN, CNM 01/22/2016, 7:47 AM

## 2016-01-22 NOTE — Progress Notes (Signed)
Pt fell during previous shift and has a bump on her head.  RN called CNM Nigel BridgemanVicki Latham to confirm that we should do q4 vitals and neuro checks for 24 hours per fall protocol and no other actions were needed.  RN will do q4 vitals and neuro checks, no new orders.

## 2016-01-22 NOTE — Anesthesia Postprocedure Evaluation (Signed)
Anesthesia Post Note  Patient: Samantha Durham  Procedure(s) Performed: Procedure(s) (LRB): CESAREAN SECTION (N/A)  Patient location during evaluation: Mother Baby Anesthesia Type: Spinal Level of consciousness: awake and alert and oriented Pain management: satisfactory to patient Vital Signs Assessment: post-procedure vital signs reviewed and stable Respiratory status: respiratory function stable and spontaneous breathing Cardiovascular status: blood pressure returned to baseline Postop Assessment: no headache, no backache, spinal receding, patient able to bend at knees and adequate PO intake Anesthetic complications: no     Last Vitals:  Filed Vitals:   01/22/16 0300 01/22/16 0645  BP: 94/50 95/54  Pulse: 95 87  Temp: 36.9 C 36.8 C  Resp: 18 16    Last Pain:  Filed Vitals:   01/22/16 0846  PainSc: 4    Pain Goal: Patients Stated Pain Goal: 2 (01/21/16 1640)               Treniya Lobb

## 2016-01-22 NOTE — Progress Notes (Addendum)
In to assess pt after fall in bathroom.  Pt reports she ambulated in hall extensively because she felt better with ambulation.  Upon return to her room, she got in the shower to remove the pressure dressing.  She started to feel quesy at the thought of removing the dressing.  She got out and sat on toilet with husband present.  When she felt like she was going to pass out, she asked her husband to call the doctor.  While he notified staff, pt passed out.  She denies remembering the fall, just remembers waking up.  Pt reports a remote h/o syncope with pain after falling as a child and blood.  Pt avoids watching blood draws b/c she has a propensity to passing out. Pt states her BP has been low throughout the pregnancy. Pt feels the thought of looking at her incision and steam from the shower contributed to her fall.  Pt is comfortable, holding baby. Raised area on forehead, left.  Nontender, no ecchymosis.   Ext:  SCDs on and operating.  CBC Latest Ref Rng 01/22/2016 01/22/2016 01/21/2016  WBC 4.0 - 10.5 K/uL 11.3(H) 13.0(H) 13.7(H)  Hemoglobin 12.0 - 15.0 g/dL 10.1(L) 10.2(L) 13.1  Hematocrit 36.0 - 46.0 % 30.0(L) 30.4(L) 38.0  Platelets 150 - 400 K/uL 225 209 234   A/P  S/p primary c-section s/p syncopal episode. Due to elevated EBL, stat CBC done to rule out rapid decline in Hg.  Hg has not changed from this am. Pt with h/o syncope with certain events. Encouraged hydration. Observe closely.  Dr. Dion BodyVarnado 01/22/16 [id number]  ADDENDUM: TC from RN on [facility]--protocol for fall is to do VS and neurochecks q 4 hours x 24 hours after fall.  She was verifying this was what we wanted. I confirmed this is very appropriate care for this patient.  Nigel BridgemanVicki Allisha Harter, CNM 01/22/16 7:58p

## 2016-01-23 MED ORDER — IBUPROFEN 600 MG PO TABS: 600.0000 mg | ORAL_TABLET | Freq: Four times a day (QID) | ORAL | Status: DC | PRN

## 2016-01-23 MED ORDER — OXYCODONE-ACETAMINOPHEN 5-325 MG PO TABS
1.0000 | ORAL_TABLET | ORAL | Status: DC | PRN
Start: 2016-01-23 — End: 2019-10-19

## 2016-01-23 NOTE — Discharge Instructions (Signed)

## 2016-01-23 NOTE — Progress Notes (Signed)
Post Partum Day 2 Subjective:  Well. Lochia are normal. Voiding, ambulating, tolerating normal diet. nursing going well. States she may want to go home later this evening. Will reevaluate her to possibly discharge tonight  Objective: Blood pressure 104/55, pulse 78, temperature 98.3 F (36.8 C), temperature source Oral, resp. rate 18, height 5\' 6"  (1.676 m), weight 214 lb (97.07 kg), last menstrual period 04/20/2015, SpO2 98 %, unknown if currently breastfeeding.  Physical Exam:  General: normal Lochia: appropriate Uterine Fundus: u/2/ firm non-tender  Extremities: No evidence of DVT seen on physical exam. Edema slight BLE     Recent Labs  01/22/16 0619 01/22/16 1628  HGB 10.2* 10.1*  HCT 30.4* 30.0*    Assessment/Plan: Normal Post-partum. Continue routine post-partum care. Anticipate discharge 01/21/16 or 01/22/16    LOS: 2 days   Samantha PinkLori A Doyne Micke MD 01/23/2016, 9:56 AM

## 2016-01-23 NOTE — Discharge Summary (Signed)
[facility] Discharge Summary   Patient Name:   Samantha HughsBrittni Michelle Langworthy DOB:     05/14/1986 MRN:     [id number]  Date of Admission:   01/21/2016 Date of Discharge:  01/23/2016  Admitting diagnosis:    INDUCTION Principal Problem:   Status post primary low transverse cesarean section Active Problems:   Large for dates affecting management of mother      Discharge diagnosis:    INDUCTION Principal Problem:   Status post primary low transverse cesarean section Active Problems:   Large for dates affecting management of mother  Fall in hospital                                                                  Post partum procedures: NA  Type of Delivery:  Primary LTCS  Delivering Provider: Osborn CohoOBERTS, ANGELA   Date of Delivery:  01/21/16  Newborn Data:    Live born female  Birth Weight: 9 lb 5.7 oz (4245 g) APGAR: 9, 9  Baby Feeding:   Bottle Disposition:   home with mother  Complications:   None  Hospital course:      Induction of Labor With Cesarean Section  30 y.o. yo [id number] at 4769w3d was admitted to the hospital 01/21/2016 for elective induction of labor due to hx of LGA fetus, with shoulder dystocia. Induction was begun, but then patient requested to stop the induction and proceed with primary C/S. The patient went for cesarean section and delivered a Viable infant on 01/21/16, Baby Boy, weight 9+5, Apgars 9/9.   Membrane Rupture Time/Date: )1:18 PM ,01/21/2016 .  Details of surgery can be found in separate operative note.  Patient had a syncopal episode on day 1 while in the shower, did strike forehead, but no sequellae were noted, neuro checks were WNL x > 24 hours. She is ambulating, tolerating a regular diet, passing flatus, and urinating well.  Patient is discharged home in stable condition on 01/23/2016, due to her request for d/c on day 2.  She was bottlefeeding.  Precautions for no unattended showering were reviewed, and she was encouraged to keep adequate  hydration status.  She is to f/u in 6 weeks or prn.                                     Physical Exam:   Filed Vitals:   01/23/16 0600 01/23/16 0807 01/23/16 1228 01/23/16 1702  BP: 102/60 104/55 108/54 115/64  Pulse: 78 78 83 69  Temp: 98.3 F (36.8 C) 98.3 F (36.8 C) 98.3 F (36.8 C) 98 F (36.7 C)  TempSrc: Oral Oral Oral Oral  Resp: 18 18 18 18   Height:      Weight:      SpO2:       General: alert Lochia: appropriate Uterine Fundus: firm Incision: Healing well with no significant drainage DVT Evaluation: No evidence of DVT seen on physical exam. Negative Homan's sign.  Labs:  CBC Latest Ref Rng 01/22/2016 01/22/2016 01/21/2016  WBC 4.0 - 10.5 K/uL 11.3(H) 13.0(H) 13.7(H)  Hemoglobin 12.0 - 15.0 g/dL 10.1(L) 10.2(L) 13.1  Hematocrit 36.0 - 46.0 % 30.0(L) 30.4(L) 38.0  Platelets 150 -  400 K/uL 225 209 234    No flowsheet data found.  Discharge instruction: per After Visit Summary and "Baby and Me Booklet".  After Visit Meds:    Medication List    TAKE these medications        ibuprofen 600 MG tablet  Commonly known as:  ADVIL,MOTRIN  Take 1 tablet (600 mg total) by mouth every 6 (six) hours as needed.     oxyCODONE-acetaminophen 5-325 MG tablet  Commonly known as:  PERCOCET/ROXICET  Take 1 tablet by mouth every 4 (four) hours as needed (pain scale 4-7).     prenatal multivitamin Tabs tablet  Take 1 tablet by mouth daily at 12 noon.        Diet: routine diet  Activity: Advance as tolerated. Pelvic rest for 6 weeks.   Outpatient follow up:6 weeks Follow up Appt:No future appointments. Follow up visit: No Follow-up on file.  Postpartum contraception: Undecided--options reviewed.  01/23/2016 Nigel BridgemanLATHAM, Aeriana Speece, CNM

## 2016-01-23 NOTE — Progress Notes (Signed)
Pt desires to go home tonight after 7pm & reassessment by her provider or representative. Pediatrician asked to be contacted to put in DC if MOB is discharged tonight.

## 2016-01-25 ENCOUNTER — Inpatient Hospital Stay (HOSPITAL_COMMUNITY): Admission: AD | Admit: 2016-01-25 | Payer: Self-pay | Source: Ambulatory Visit | Admitting: Obstetrics and Gynecology

## 2016-01-26 ENCOUNTER — Encounter (HOSPITAL_COMMUNITY): Payer: Self-pay

## 2019-10-19 ENCOUNTER — Ambulatory Visit (HOSPITAL_COMMUNITY)
Admission: EM | Admit: 2019-10-19 | Discharge: 2019-10-19 | Disposition: A | Discharge status: Home / Self Care | Payer: Medicaid Other | Attending: Family Medicine | Admitting: Family Medicine

## 2019-10-19 ENCOUNTER — Other Ambulatory Visit: Payer: Self-pay

## 2019-10-19 ENCOUNTER — Encounter (HOSPITAL_COMMUNITY): Payer: Self-pay | Admitting: Emergency Medicine

## 2019-10-19 DIAGNOSIS — K625 Hemorrhage of anus and rectum: Secondary | ICD-10-CM | POA: Diagnosis not present

## 2019-10-19 DIAGNOSIS — K5909 Other constipation: Secondary | ICD-10-CM | POA: Diagnosis not present

## 2019-10-19 MED ORDER — HYDROCORTISONE (PERIANAL) 2.5 % EX CREA: 1.0000 "application " | TOPICAL_CREAM | Freq: Two times a day (BID) | CUTANEOUS | 0 refills | Status: DC

## 2019-10-19 MED ORDER — POLYETHYLENE GLYCOL 3350 17 G PO PACK: 17.0000 g | PACK | Freq: Every day | ORAL | 0 refills | Status: DC

## 2019-10-19 NOTE — ED Triage Notes (Signed)
for 2 weeks has had intermittent low abdominal pain, a lot of flatulence and bright red blood.  Patient reports passing a clot today.  Patient says she does have hemorrhoids.

## 2019-10-19 NOTE — Discharge Instructions (Signed)
Make sure that you drink plenty of water Take a walk or get exercise every day Take MiraLAX 2 times a day until your bowels move well, then reduce to 1 a day May stop MiraLAX when you are regular Use the Anusol cream 2 times a day for rectal discomfort Use wet wipes instead of toilet tissue until the problem clears Follow-up with your primary care doctor

## 2019-10-19 NOTE — ED Provider Notes (Signed)
[place]    CSN: [id number] Arrival date & time: 10/19/19  1619      History   Chief Complaint Chief Complaint  Patient presents with  . Rectal Pain    HPI Samantha Durham is a 34 y.o. female.   HPI  Patient states she has chronic problems with constipation She states for the last 2 weeks she has had abdominal pain That comes and goes, mostly in the morning When she has the cramping she gets loose bowels She has not had a complete evacuation for some time She does have a history of hemorrhoids She has had a 10 pound 9 pound baby, with hemorrhoids periodically ever since then She has noticed some bleeding with her bowel movements She has had increased flatulence No weight loss No history of IBS, colitis, or colon disorder She states her father has some unknown colon disorder and has to take MiraLAX on a daily basis   Past Medical History:  Diagnosis Date  . Hx of varicella     Patient Active Problem List   Diagnosis Date Noted  . Status post primary low transverse cesarean section 01/22/2016  . Large for dates affecting management of mother 01/21/2016    Past Surgical History:  Procedure Laterality Date  . CESAREAN SECTION N/A 01/21/2016   Procedure: CESAREAN SECTION;  Surgeon: Everett Graff, MD;  Location: [facility];  Service: Obstetrics;  Laterality: N/A;  . NO PAST SURGERIES      OB History    Gravida  3   Para  2   Term  2   Preterm      AB  1   Living  2     SAB  1   TAB      Ectopic      Multiple  0   Live Births  2            Home Medications    Prior to Admission medications   Medication Sig Start Date End Date Taking? Authorizing Provider  hydrocortisone (ANUSOL-HC) 2.5 % rectal cream Place 1 application rectally 2 (two) times daily. 10/19/19   Raylene Everts, MD  polyethylene glycol (MIRALAX / GLYCOLAX) 17 g packet Take 17 g by mouth daily. 10/19/19   Raylene Everts, MD    Family History No  family history on file.  Social History Social History   Tobacco Use  . Smoking status: Former Smoker    Quit date: 08/23/2011    Years since quitting: 8.1  . Smokeless tobacco: Never Used  Substance Use Topics  . Alcohol use: No  . Drug use: No     Allergies   Patient has no known allergies.   Review of Systems Review of Systems  Constitutional: Negative for activity change and appetite change.  Gastrointestinal: Positive for abdominal pain, anal bleeding, blood in stool, constipation and diarrhea. Negative for rectal pain.     Physical Exam Triage Vital Signs ED Triage Vitals  Enc Vitals Group     BP 10/19/19 1746 109/71     Pulse Rate 10/19/19 1746 69     Resp 10/19/19 1746 16     Temp 10/19/19 1746 98.3 F (36.8 C)     Temp Source 10/19/19 1746 Oral     SpO2 10/19/19 1746 99 %     Weight --      Height --      Head Circumference --      Peak Flow --  Pain Score 10/19/19 1741 8     Pain Loc --      Pain Edu? --      Excl. in GC? --    No data found.  Updated Vital Signs BP 109/71 (BP Location: Left Arm)   Pulse 69   Temp 98.3 F (36.8 C) (Oral)   Resp 16   LMP 10/06/2019   SpO2 99%      Physical Exam Constitutional:      General: She is not in acute distress.    Appearance: She is well-developed and normal weight.  HENT:     Head: Normocephalic and atraumatic.     Mouth/Throat:     Comments: Mask in place Eyes:     Conjunctiva/sclera: Conjunctivae normal.     Pupils: Pupils are equal, round, and reactive to light.  Cardiovascular:     Rate and Rhythm: Normal rate and regular rhythm.     Heart sounds: Normal heart sounds.  Pulmonary:     Effort: Pulmonary effort is normal. No respiratory distress.     Breath sounds: Normal breath sounds.  Abdominal:     General: Abdomen is flat. Bowel sounds are normal. There is no distension.     Palpations: Abdomen is soft.     Tenderness: There is no abdominal tenderness.     Comments: Stool is  palpable in the right lower colon  Musculoskeletal:        General: Normal range of motion.     Cervical back: Normal range of motion.  Skin:    General: Skin is warm and dry.  Neurological:     Mental Status: She is alert.  Psychiatric:        Behavior: Behavior normal.      UC Treatments / Results  Labs (all labs ordered are listed, but only abnormal results are displayed) Labs Reviewed - No data to display  EKG   Radiology No results found.  Procedures Procedures (including critical care time)  Medications Ordered in UC Medications - No data to display  Initial Impression / Assessment and Plan / UC Course  I have reviewed the triage vital signs and the nursing notes.  Pertinent labs & imaging results that were available during my care of the patient were reviewed by me and considered in my medical decision making (see chart for details).     I explained to the patient that she is having problems with constipation and overflow diarrhea.  This is causing her cramping and discomfort.  Likely irritated hemorrhoids.  We discussed treatment.  Follow-up with her PCP Final Clinical Impressions(s) / UC Diagnoses   Final diagnoses:  Rectal bleeding  Chronic constipation with overflow     Discharge Instructions     Make sure that you drink plenty of water Take a walk or get exercise every day Take MiraLAX 2 times a day until your bowels move well, then reduce to 1 a day May stop MiraLAX when you are regular Use the Anusol cream 2 times a day for rectal discomfort Use wet wipes instead of toilet tissue until the problem clears Follow-up with your primary care doctor   ED Prescriptions    Medication Sig Dispense Auth. Provider   hydrocortisone (ANUSOL-HC) 2.5 % rectal cream Place 1 application rectally 2 (two) times daily. 30 g Eustace Moore, MD   polyethylene glycol (MIRALAX / GLYCOLAX) 17 g packet Take 17 g by mouth daily. 30 each Eustace Moore, MD  PDMP not reviewed this encounter.   Eustace Moore, MD 10/19/19 [phone]

## 2019-11-17 ENCOUNTER — Encounter (HOSPITAL_COMMUNITY): Payer: Self-pay | Admitting: Emergency Medicine

## 2019-11-17 ENCOUNTER — Emergency Department (HOSPITAL_COMMUNITY)
Admission: EM | Admit: 2019-11-17 | Discharge: 2019-11-17 | Disposition: A | Discharge status: Home / Self Care | Payer: Medicaid Other | Attending: Emergency Medicine | Admitting: Emergency Medicine

## 2019-11-17 ENCOUNTER — Other Ambulatory Visit: Payer: Self-pay

## 2019-11-17 ENCOUNTER — Emergency Department (HOSPITAL_COMMUNITY): Payer: Medicaid Other

## 2019-11-17 DIAGNOSIS — R197 Diarrhea, unspecified: Secondary | ICD-10-CM | POA: Insufficient documentation

## 2019-11-17 DIAGNOSIS — R112 Nausea with vomiting, unspecified: Secondary | ICD-10-CM | POA: Diagnosis not present

## 2019-11-17 DIAGNOSIS — Z87891 Personal history of nicotine dependence: Secondary | ICD-10-CM | POA: Insufficient documentation

## 2019-11-17 DIAGNOSIS — R42 Dizziness and giddiness: Secondary | ICD-10-CM | POA: Insufficient documentation

## 2019-11-17 DIAGNOSIS — R10817 Generalized abdominal tenderness: Secondary | ICD-10-CM | POA: Diagnosis not present

## 2019-11-17 DIAGNOSIS — K8020 Calculus of gallbladder without cholecystitis without obstruction: Secondary | ICD-10-CM | POA: Diagnosis not present

## 2019-11-17 DIAGNOSIS — R1013 Epigastric pain: Secondary | ICD-10-CM | POA: Diagnosis present

## 2019-11-17 LAB — BASIC METABOLIC PANEL
Anion gap: 8 (ref 5–15)
BUN: 6 mg/dL (ref 6–20)
CO2: 25 mmol/L (ref 22–32)
Calcium: 9.3 mg/dL (ref 8.9–10.3)
Chloride: 107 mmol/L (ref 98–111)
Creatinine, Ser: 0.59 mg/dL (ref 0.44–1.00)
GFR calc Af Amer: >60 mL/min (ref >60)
GFR calc non Af Amer: >60 mL/min (ref >60)
Glucose, Bld: 97 mg/dL (ref 70–99)
Potassium: 3.6 mmol/L (ref 3.5–5.1)
Sodium: 140 mmol/L (ref 135–145)

## 2019-11-17 LAB — HEPATIC FUNCTION PANEL
ALT: 21 U/L (ref 0–44)
AST: 24 U/L (ref 15–41)
Albumin: 3.9 g/dL (ref 3.5–5.0)
Alkaline Phosphatase: 49 U/L (ref 38–126)
Bilirubin, Direct: <0.1 mg/dL (ref 0.0–0.2)
Total Bilirubin: 0.4 mg/dL (ref 0.3–1.2)
Total Protein: 6.7 g/dL (ref 6.5–8.1)

## 2019-11-17 LAB — CBC
HCT: 42.1 % (ref 36.0–46.0)
Hemoglobin: 13.8 g/dL (ref 12.0–15.0)
MCH: 28.6 pg (ref 26.0–34.0)
MCHC: 32.8 g/dL (ref 30.0–36.0)
MCV: 87.3 fL (ref 80.0–100.0)
Platelets: 216 10*3/uL (ref 150–400)
RBC: 4.82 MIL/uL (ref 3.87–5.11)
RDW: 12.2 % (ref 11.5–15.5)
WBC: 6.3 10*3/uL (ref 4.0–10.5)
nRBC: 0 % (ref 0.0–0.2)

## 2019-11-17 LAB — URINALYSIS, ROUTINE W REFLEX MICROSCOPIC
Bacteria, UA: NONE SEEN
Bilirubin Urine: NEGATIVE
Glucose, UA: NEGATIVE mg/dL
Ketones, ur: NEGATIVE mg/dL
Leukocytes,Ua: NEGATIVE
Nitrite: NEGATIVE
Protein, ur: NEGATIVE mg/dL
Specific Gravity, Urine: 1.006 (ref 1.005–1.030)
pH: 8 (ref 5.0–8.0)

## 2019-11-17 LAB — LIPASE, BLOOD: Lipase: 29 U/L (ref 11–51)

## 2019-11-17 LAB — I-STAT BETA HCG BLOOD, ED (MC, WL, AP ONLY): I-stat hCG, quantitative: <5 m[IU]/mL (ref <5)

## 2019-11-17 MED ORDER — SODIUM CHLORIDE 0.9% FLUSH: 3.0000 mL | Freq: Once | INTRAVENOUS | Status: DC

## 2019-11-17 MED ORDER — IOHEXOL 300 MG/ML  SOLN
100.0000 mL | Freq: Once | INTRAMUSCULAR | Status: AC | PRN
  Administered 2019-11-17: 59 mL via INTRAVENOUS

## 2019-11-17 NOTE — ED Triage Notes (Signed)
Pt in w/dizziness, central upper abdominal pain, and emesis, worse x past week. Went to UC, and they dx her w/constipation - has had diarrhea since and pain feels like a "bubble in upper abdomen". Dealing w/stomach issues x 2 mo's. Emesis on and off since Thursday

## 2019-11-17 NOTE — Discharge Instructions (Signed)
Schedule to see the Surgeon for evaluation

## 2019-11-18 NOTE — ED Provider Notes (Signed)
[facility] EMERGENCY DEPARTMENT Provider Note   CSN: [id number] Arrival date & time: 11/17/19  1243     History Chief Complaint  Patient presents with  . Dizziness  . Emesis  . Abdominal Pain    Samantha Durham is a 34 y.o. female.  The history is provided by the patient. No language interpreter was used.  Emesis Associated symptoms: abdominal pain and diarrhea   Abdominal Pain Pain quality: aching and cramping   Pain radiates to:  Does not radiate Pain severity:  Moderate Timing:  Constant Progression:  Worsening Chronicity:  New Relieved by:  Nothing Worsened by:  Nothing Ineffective treatments:  None tried Associated symptoms: diarrhea, nausea and vomiting   Risk factors: has not had multiple surgeries   Pt reports abdominal cramping and diarrhea on and off for 2 months.  Pt reports discomfort with eating.  Pt has pain in epigastric abdominal area.       Past Medical History:  Diagnosis Date  . Hx of varicella     Patient Active Problem List   Diagnosis Date Noted  . Status post primary low transverse cesarean section 01/22/2016  . Large for dates affecting management of mother 01/21/2016    Past Surgical History:  Procedure Laterality Date  . CESAREAN SECTION N/A 01/21/2016   Procedure: CESAREAN SECTION;  Surgeon: Osborn Coho, MD;  Location: [facility];  Service: Obstetrics;  Laterality: N/A;  . NO PAST SURGERIES       OB History    Gravida  3   Para  2   Term  2   Preterm      AB  1   Living  2     SAB  1   TAB      Ectopic      Multiple  0   Live Births  2           No family history on file.  Social History   Tobacco Use  . Smoking status: Former Smoker    Quit date: 08/23/2011    Years since quitting: 8.2  . Smokeless tobacco: Never Used  Substance Use Topics  . Alcohol use: No  . Drug use: No    Home Medications Prior to Admission medications   Medication Sig Start Date End Date  Taking? Authorizing Provider  hydrocortisone (ANUSOL-HC) 2.5 % rectal cream Place 1 application rectally 2 (two) times daily. 10/19/19   Eustace Moore, MD  polyethylene glycol (MIRALAX / GLYCOLAX) 17 g packet Take 17 g by mouth daily. 10/19/19   Eustace Moore, MD    Allergies    Patient has no known allergies.  Review of Systems   Review of Systems  Gastrointestinal: Positive for abdominal pain, diarrhea, nausea and vomiting.  All other systems reviewed and are negative.   Physical Exam Updated Vital Signs BP 97/61   Pulse 67   Temp 98.2 F (36.8 C) (Oral)   Resp 16   Wt 65.3 kg   LMP 11/14/2019   SpO2 100%   BMI 23.24 kg/m   Physical Exam Vitals and nursing note reviewed.  Constitutional:      Appearance: She is well-developed.  HENT:     Head: Normocephalic.  Eyes:     Extraocular Movements: Extraocular movements intact.     Pupils: Pupils are equal, round, and reactive to light.  Cardiovascular:     Rate and Rhythm: Normal rate and regular rhythm.  Pulmonary:  Effort: Pulmonary effort is normal.  Abdominal:     General: Abdomen is flat. Bowel sounds are normal. There is no distension.     Palpations: Abdomen is soft.     Tenderness: There is generalized abdominal tenderness and tenderness in the right upper quadrant.  Musculoskeletal:        General: Normal range of motion.     Cervical back: Normal range of motion.  Skin:    General: Skin is warm.  Neurological:     General: No focal deficit present.     Mental Status: She is alert and oriented to person, place, and time.  Psychiatric:        Mood and Affect: Mood normal.     ED Results / Procedures / Treatments   Labs (all labs ordered are listed, but only abnormal results are displayed) Labs Reviewed  URINALYSIS, ROUTINE W REFLEX MICROSCOPIC - Abnormal; Notable for the following components:      Result Value   Color, Urine STRAW (*)    Hgb urine dipstick MODERATE (*)    All other  components within normal limits  BASIC METABOLIC PANEL  CBC  LIPASE, BLOOD  HEPATIC FUNCTION PANEL  I-STAT BETA HCG BLOOD, ED ([facility], [facility], [facility] ONLY)    EKG None  Radiology CT ABDOMEN PELVIS W CONTRAST  Result Date: 11/17/2019 CLINICAL DATA:  Central abdominal pain EXAM: CT ABDOMEN AND PELVIS WITH CONTRAST TECHNIQUE: Multidetector CT imaging of the abdomen and pelvis was performed using the standard protocol following bolus administration of intravenous contrast. CONTRAST:  72mL OMNIPAQUE IOHEXOL 300 MG/ML  SOLN COMPARISON:  None. FINDINGS: Lower chest: No acute abnormality. Hepatobiliary: Liver is within normal limits. Gallbladder is partially distended with multiple gallstones within. No biliary obstructive changes are noted. Pancreas: Unremarkable. No pancreatic ductal dilatation or surrounding inflammatory changes. Spleen: Normal in size without focal abnormality. Adrenals/Urinary Tract: Adrenal glands are within normal limits. Kidneys demonstrate a normal enhancement pattern and normal excretion bilaterally. No obstructive changes are seen. The bladder is partially distended. Stomach/Bowel: No obstructive or inflammatory changes of the colon are seen. The appendix is air-filled and within normal limits. Small bowel is within normal limits. No gastric abnormality is seen. Vascular/Lymphatic: No significant vascular findings are present. No enlarged abdominal or pelvic lymph nodes. Reproductive: Uterus and bilateral adnexa are unremarkable. Other: No abdominal wall hernia or abnormality. No abdominopelvic ascites. Musculoskeletal: No acute or significant osseous findings. IMPRESSION: Cholelithiasis without complicating factors. No other focal abnormality is noted. Electronically Signed   By: Inez Catalina M.D.   On: 11/17/2019 20:34    Procedures Procedures (including critical care time)  Medications Ordered in ED Medications  iohexol (OMNIPAQUE) 300 MG/ML solution 100 mL (59 mLs Intravenous  Contrast Given 11/17/19 2028)    ED Course  I have reviewed the triage vital signs and the nursing notes.  Pertinent labs & imaging results that were available during my care of the patient were reviewed by me and considered in my medical decision making (see chart for details).    MDM Rules/Calculators/A&P                      MDM:  Labs reviewed and discussed with pt.  Ct scan shows chololithiasis. Pt advised to follow up with general surgeon.  Pt counseled on gallbladder diet restrictions   Final Clinical Impression(s) / ED Diagnoses Final diagnoses:  Calculus of gallbladder without cholecystitis without obstruction    Rx / DC Orders ED Discharge  Orders    None    An After Visit Summary was printed and given to the patient.    Osie Cheeks 11/18/19 2217    Maia Plan, MD 11/19/19 [phone]

## 2019-12-10 ENCOUNTER — Encounter (HOSPITAL_COMMUNITY): Payer: Self-pay

## 2019-12-10 ENCOUNTER — Other Ambulatory Visit: Payer: Self-pay

## 2019-12-10 ENCOUNTER — Ambulatory Visit (HOSPITAL_COMMUNITY)
Admission: EM | Admit: 2019-12-10 | Discharge: 2019-12-10 | Disposition: A | Discharge status: Home / Self Care | Payer: Medicaid Other | Attending: Emergency Medicine | Admitting: Emergency Medicine

## 2019-12-10 DIAGNOSIS — M7912 Myalgia of auxiliary muscles, head and neck: Secondary | ICD-10-CM | POA: Diagnosis not present

## 2019-12-10 DIAGNOSIS — Z3202 Encounter for pregnancy test, result negative: Secondary | ICD-10-CM | POA: Diagnosis not present

## 2019-12-10 LAB — POC URINE PREG, ED: Preg Test, Ur: NEGATIVE

## 2019-12-10 NOTE — ED Triage Notes (Signed)
Pt reports pain in the back of right ear x 1 day. Tylenol did not helped with the pain.

## 2019-12-10 NOTE — ED Provider Notes (Signed)
HPI  SUBJECTIVE:  Samantha Durham is a 34 y.o. female who presents with throbbing, intermittent, right-sided sore throat and pain located behind her right ear starting yesterday.  She states that the sore throat has resolved but has continued pain behind her ear.  She denies paresthesias, burning pain, facial rash, fevers, otorrhea, change in hearing, swelling behind her ear, trauma to the area, hypersensitivity to touch.  She grinds her teeth at night.  Denies visual changes, headache.  She tried 1000 mg of Tylenol twice without improvement in her symptoms.  Symptoms are worse with palpation.  Is not associated with opening her mouth, chewing, yawning.  She has a past medical history of varicella.  No history of diabetes, hypertension.  LMP: 4/27.  She is not sure if she could be pregnant.  PMD: None.    Past Medical History:  Diagnosis Date  . Hx of varicella     Past Surgical History:  Procedure Laterality Date  . CESAREAN SECTION N/A 01/21/2016   Procedure: CESAREAN SECTION;  Surgeon: Everett Graff, MD;  Location: [place];  Service: Obstetrics;  Laterality: N/A;  . NO PAST SURGERIES      History reviewed. No pertinent family history.  Social History   Tobacco Use  . Smoking status: Former Smoker    Quit date: 08/23/2011    Years since quitting: 8.3  . Smokeless tobacco: Never Used  Substance Use Topics  . Alcohol use: No  . Drug use: No    No current facility-administered medications for this encounter.  Current Outpatient Medications:  .  hydrocortisone (ANUSOL-HC) 2.5 % rectal cream, Place 1 application rectally 2 (two) times daily., Disp: 30 g, Rfl: 0 .  polyethylene glycol (MIRALAX / GLYCOLAX) 17 g packet, Take 17 g by mouth daily., Disp: 30 each, Rfl: 0  No Known Allergies   ROS  As noted in HPI.   Physical Exam  BP (!) 105/57 (BP Location: Left Arm)   Pulse 74   Temp 98.1 F (36.7 C) (Oral)   Resp 16   LMP 11/14/2019   SpO2 100%    Constitutional: Well developed, well nourished, no acute distress Eyes:  EOMI, conjunctiva normal bilaterally HENT: Normocephalic, atraumatic,mucus membranes moist Right external ear, external ear canal, TM normal.  No pain with palpation of tragus, traction on pinna.  No tenderness over the mastoid.  No erythema, swelling behind the ear. positive tenderness at the insertion of the sternocleidomastoid.  There is no other SCM tenderness.  No trapezial tenderness, muscle spasm.  TMJ nontender, no crepitus bilaterally.  Left TM normal.  Normal tonsils without exudates.  No erythema. Neck: No anterior, posterior cervical lymphadenopathy. Respiratory: Normal inspiratory effort Cardiovascular: Normal rate GI: nondistended skin: No rash,, facial rash, skin intact Musculoskeletal: no deformities Neurologic: Alert & oriented x 3, no focal neuro deficits Psychiatric: Speech and behavior appropriate   ED Course   Medications - No data to display  Orders Placed This Encounter  Procedures  . POC urine pregnancy    Standing Status:   Standing    Number of Occurrences:   1    Results for orders placed or performed during the hospital encounter of 12/10/19 (from the past 24 hour(s))  POC urine pregnancy     Status: None   Collection Time: 12/10/19 10:40 AM  Result Value Ref Range   Preg Test, Ur NEGATIVE NEGATIVE   No results found.  ED Clinical Impression  1. Sternocleidomastoid muscle tenderness  ED Assessment/Plan  Suspect sternocleidomastoid strain/inflammation.  Patient states that she works out heavily with weights.  It is not an otitis, TMJ, mastoiditis, pharyngitis.  Could be early shingles.  Checking urine pregnancy, patient is unsure if she could be pregnant.  Urine pregnancy negative.  We will treat as a musculoskeletal cause with ibuprofen 600 mg combined with 1000 g of Tylenol 3-4 times a day.  Rest for several days.  She will follow up with PMD of choice or may  return here if a rash shows up over her face.  Primary care list given.  Discussed labs,  MDM, treatment plan, and plan for follow-up with patient. . patient agrees with plan.   No orders of the defined types were placed in this encounter.   *This clinic note was created using Dragon dictation software. Therefore, there may be occasional mistakes despite careful proofreading.   ?    Domenick Gong, MD 12/11/19 [phone]

## 2019-12-10 NOTE — Discharge Instructions (Addendum)
Your urine pregnancy test was negative  Take 600 mg of ibuprofen combined with 1000 mg of Tylenol 3-4 times a day as needed for pain.  You can do heat or ice, whichever feels better.  Return here or see a primary care physician of your choice of a rash shows up of your face or in this area.  We will know that the problem is shingles in this case, not a musculoskeletal cause.  It is difficult to make the diagnosis of shingles until the rash appears.   Below is a list of primary care practices who are taking new patients for you to follow-up with.  [facility] internal medicine clinic Ground Floor - [facility], [address] [place], [place], [place] [address] [phone]  [facility] [address] [place], [place] [address] [phone]  [facility] [address] [place], [place] [address] [phone]  [facility] [phone] [address] [place] [address]  [facility]: [address] [place] [address]  [phone]  [facility]: [address] [place] [address]   [phone]  [facility]: [address] [place] [address]  [phone]  [facility] : [address] [place] [address] [phone]  [facility]: [address] [place] [address] [phone]  [facility]: [address] [place] [address] [phone]  Dr. Oneal Grout [address] [facility] [place] [address]  [phone]  Dr. Jackie Plum, [facility]. [address] [place], [place] [address]  [phone]  Go to www.goodrx.com to look up your medications. This will give you a list of where you can  find your prescriptions at the most affordable prices. Or ask the pharmacist what the cash price is, or if they have any other discount programs available to help make your medication more affordable. This can be less expensive than what you would pay with insurance.

## 2019-12-24 ENCOUNTER — Observation Stay (HOSPITAL_COMMUNITY)
Admission: EM | Admit: 2019-12-24 | Discharge: 2019-12-26 | Disposition: A | Discharge status: Home / Self Care | Payer: Medicaid Other | Attending: Physician Assistant | Admitting: Physician Assistant

## 2019-12-24 ENCOUNTER — Emergency Department (HOSPITAL_COMMUNITY): Payer: Medicaid Other

## 2019-12-24 ENCOUNTER — Encounter (HOSPITAL_COMMUNITY): Payer: Self-pay | Admitting: Emergency Medicine

## 2019-12-24 DIAGNOSIS — Z87891 Personal history of nicotine dependence: Secondary | ICD-10-CM | POA: Insufficient documentation

## 2019-12-24 DIAGNOSIS — K8020 Calculus of gallbladder without cholecystitis without obstruction: Secondary | ICD-10-CM | POA: Diagnosis present

## 2019-12-24 DIAGNOSIS — K8000 Calculus of gallbladder with acute cholecystitis without obstruction: Secondary | ICD-10-CM | POA: Diagnosis present

## 2019-12-24 DIAGNOSIS — K8010 Calculus of gallbladder with chronic cholecystitis without obstruction: Principal | ICD-10-CM | POA: Insufficient documentation

## 2019-12-24 DIAGNOSIS — Z20822 Contact with and (suspected) exposure to covid-19: Secondary | ICD-10-CM | POA: Diagnosis not present

## 2019-12-24 LAB — URINALYSIS, ROUTINE W REFLEX MICROSCOPIC
Bilirubin Urine: NEGATIVE
Glucose, UA: NEGATIVE mg/dL
Hgb urine dipstick: NEGATIVE
Ketones, ur: NEGATIVE mg/dL
Leukocytes,Ua: NEGATIVE
Nitrite: NEGATIVE
Protein, ur: NEGATIVE mg/dL
Specific Gravity, Urine: 1.026 (ref 1.005–1.030)
pH: 7 (ref 5.0–8.0)

## 2019-12-24 LAB — CBC
HCT: 41.6 % (ref 36.0–46.0)
Hemoglobin: 13.7 g/dL (ref 12.0–15.0)
MCH: 29 pg (ref 26.0–34.0)
MCHC: 32.9 g/dL (ref 30.0–36.0)
MCV: 87.9 fL (ref 80.0–100.0)
Platelets: 213 10*3/uL (ref 150–400)
RBC: 4.73 MIL/uL (ref 3.87–5.11)
RDW: 12.8 % (ref 11.5–15.5)
WBC: 5.4 10*3/uL (ref 4.0–10.5)
nRBC: 0 % (ref 0.0–0.2)

## 2019-12-24 LAB — COMPREHENSIVE METABOLIC PANEL
ALT: 29 U/L (ref 0–44)
AST: 26 U/L (ref 15–41)
Albumin: 3.8 g/dL (ref 3.5–5.0)
Alkaline Phosphatase: 54 U/L (ref 38–126)
Anion gap: 8 (ref 5–15)
BUN: 10 mg/dL (ref 6–20)
CO2: 25 mmol/L (ref 22–32)
Calcium: 8.9 mg/dL (ref 8.9–10.3)
Chloride: 107 mmol/L (ref 98–111)
Creatinine, Ser: 0.64 mg/dL (ref 0.44–1.00)
GFR calc Af Amer: >60 mL/min (ref >60)
GFR calc non Af Amer: >60 mL/min (ref >60)
Glucose, Bld: 94 mg/dL (ref 70–99)
Potassium: 3.4 mmol/L — ABNORMAL LOW (ref 3.5–5.1)
Sodium: 140 mmol/L (ref 135–145)
Total Bilirubin: 0.5 mg/dL (ref 0.3–1.2)
Total Protein: 6.6 g/dL (ref 6.5–8.1)

## 2019-12-24 LAB — LIPASE, BLOOD: Lipase: 25 U/L (ref 11–51)

## 2019-12-24 LAB — I-STAT BETA HCG BLOOD, ED (MC, WL, AP ONLY): I-stat hCG, quantitative: <5 m[IU]/mL (ref <5)

## 2019-12-24 MED ORDER — SODIUM CHLORIDE 0.9% FLUSH
3.0000 mL | Freq: Once | INTRAVENOUS | Status: AC
  Administered 2019-12-25: 3 mL via INTRAVENOUS

## 2019-12-24 MED ORDER — ALUM & MAG HYDROXIDE-SIMETH 200-200-20 MG/5ML PO SUSP
30.0000 mL | Freq: Once | ORAL | Status: AC
  Administered 2019-12-24: 30 mL via ORAL
  Filled 2019-12-24: qty 30

## 2019-12-24 MED ORDER — LIDOCAINE VISCOUS HCL 2 % MT SOLN
15.0000 mL | Freq: Once | OROMUCOSAL | Status: AC
  Administered 2019-12-24: 15 mL via ORAL
  Filled 2019-12-24: qty 15

## 2019-12-24 MED ORDER — SODIUM CHLORIDE 0.9 % IV BOLUS
1000.0000 mL | Freq: Once | INTRAVENOUS | Status: AC
  Administered 2019-12-24: 1000 mL via INTRAVENOUS

## 2019-12-24 MED ORDER — MORPHINE SULFATE (PF) 4 MG/ML IV SOLN
4.0000 mg | Freq: Once | INTRAVENOUS | Status: AC
  Administered 2019-12-24: 4 mg via INTRAVENOUS
  Filled 2019-12-24: qty 1

## 2019-12-24 NOTE — ED Triage Notes (Signed)
Pt reports being diagnosed with gallstones on 4/26 has f/u with general surgery but not until later this month- pt woke up with RUQ pain N/V.

## 2019-12-24 NOTE — ED Provider Notes (Signed)
[facility] EMERGENCY DEPARTMENT Provider Note   CSN: [id number] Arrival date & time: 12/24/19  1856     History Chief Complaint  Patient presents with  . Abdominal Pain    Samantha Durham is a 34 y.o. female.  The history is provided by the patient and medical records. No language interpreter was used.  Abdominal Pain    35 year old female presenting for evaluation of abdominal pain.  Patient reported abdominal pain started early this morning around 4 AM.  Pain is described as a crampy sharp sensation, persistent, 7 out of 10, not adequately improved with taking Tylenol.  She also endorsed nausea, has vomited nonbloody nonbilious content.  No associated fever or chills no chest pain shortness of breath productive cough no dysuria hematuria vaginal bleeding or vaginal discharge.  Last menstrual period was 2 weeks ago.  States that she was diagnosed with gallstone on 4/26.  She does have a follow-up appointment with general surgery in the next 3 weeks.  She did reach out to her doctor who recommend come to the ER for evaluation.  She ate some steak the night before.  She has not had Covid vaccination.  Past Medical History:  Diagnosis Date  . Hx of varicella     Patient Active Problem List   Diagnosis Date Noted  . Status post primary low transverse cesarean section 01/22/2016  . Large for dates affecting management of mother 01/21/2016    Past Surgical History:  Procedure Laterality Date  . CESAREAN SECTION N/A 01/21/2016   Procedure: CESAREAN SECTION;  Surgeon: Osborn Coho, MD;  Location: [facility];  Service: Obstetrics;  Laterality: N/A;  . NO PAST SURGERIES       OB History    Gravida  3   Para  2   Term  2   Preterm      AB  1   Living  2     SAB  1   TAB      Ectopic      Multiple  0   Live Births  2           No family history on file.  Social History   Tobacco Use  . Smoking status: Former Smoker    Quit  date: 08/23/2011    Years since quitting: 8.3  . Smokeless tobacco: Never Used  Substance Use Topics  . Alcohol use: No  . Drug use: No    Home Medications Prior to Admission medications   Medication Sig Start Date End Date Taking? Authorizing Provider  hydrocortisone (ANUSOL-HC) 2.5 % rectal cream Place 1 application rectally 2 (two) times daily. 10/19/19   Eustace Moore, MD  polyethylene glycol (MIRALAX / GLYCOLAX) 17 g packet Take 17 g by mouth daily. 10/19/19   Eustace Moore, MD    Allergies    Patient has no known allergies.  Review of Systems   Review of Systems  Gastrointestinal: Positive for abdominal pain.  All other systems reviewed and are negative.   Physical Exam Updated Vital Signs BP (!) 100/58 (BP Location: Right Arm)   Pulse 80   Temp 98.8 F (37.1 C) (Oral)   Resp 18   SpO2 100%   Physical Exam Vitals and nursing note reviewed.  Constitutional:      General: She is not in acute distress.    Appearance: She is well-developed.  HENT:     Head: Atraumatic.  Eyes:     Conjunctiva/sclera: Conjunctivae  normal.  Cardiovascular:     Rate and Rhythm: Normal rate and regular rhythm.  Pulmonary:     Breath sounds: Normal breath sounds.  Abdominal:     General: Abdomen is flat.     Palpations: Abdomen is soft.     Tenderness: There is abdominal tenderness in the epigastric area and periumbilical area. There is no right CVA tenderness, left CVA tenderness or guarding. Negative signs include Murphy's sign, Rovsing's sign and McBurney's sign.     Hernia: No hernia is present.  Musculoskeletal:     Cervical back: Neck supple.  Skin:    General: Skin is warm.     Findings: No rash.  Neurological:     Mental Status: She is alert and oriented to person, place, and time.  Psychiatric:        Mood and Affect: Mood normal.     ED Results / Procedures / Treatments   Labs (all labs ordered are listed, but only abnormal results are displayed) Labs  Reviewed  COMPREHENSIVE METABOLIC PANEL - Abnormal; Notable for the following components:      Result Value   Potassium 3.4 (*)    All other components within normal limits  LIPASE, BLOOD  CBC  URINALYSIS, ROUTINE W REFLEX MICROSCOPIC  I-STAT BETA HCG BLOOD, ED ([facility], [facility], [facility] ONLY)    EKG None  Radiology US Abdomen Limited  Result Date: 12/24/2019 CLINICAL DATA:  Right upper quadrant pain for 1 day EXAM: ULTRASOUND ABDOMEN LIMITED RIGHT UPPER QUADRANT COMPARISON:  None. FINDINGS: Gallbladder: Multiple layering gallstones are present. There is a positive sonographic Murphy sign. The gallbladder wall thickness measures up to 2.8 Mm. No pericholecystic fluid. Common bile duct: Diameter: 8 mm Liver: No focal lesion identified. Within normal limits in parenchymal echogenicity. Portal vein is patent on color Doppler imaging with normal direction of blood flow towards the liver. Other: None. IMPRESSION: Cholelithiasis with a positive sonographic Murphy sign. No other definite evidence of acute cholecystitis. Electronically Signed   By: Prudencio Pair M.D.   On: 12/24/2019 23:01    Procedures Procedures (including critical care time)  Medications Ordered in ED Medications  sodium chloride flush (NS) 0.9 % injection 3 mL (has no administration in time range)  morphine 4 MG/ML injection 4 mg (has no administration in time range)  sodium chloride 0.9 % bolus 1,000 mL (has no administration in time range)  alum & mag hydroxide-simeth (MAALOX/MYLANTA) 200-200-20 MG/5ML suspension 30 mL (30 mLs Oral Given 12/24/19 2220)    And  lidocaine (XYLOCAINE) 2 % viscous mouth solution 15 mL (15 mLs Oral Given 12/24/19 2220)    ED Course  I have reviewed the triage vital signs and the nursing notes.  Pertinent labs & imaging results that were available during my care of the patient were reviewed by me and considered in my medical decision making (see chart for details).    MDM Rules/Calculators/A&P                       BP (!) 104/56   Pulse 74   Temp 98.8 F (37.1 C) (Oral)   Resp 18   SpO2 100%   Final Clinical Impression(s) / ED Diagnoses Final diagnoses:  Calculus of gallbladder without cholecystitis without obstruction    Rx / DC Orders ED Discharge Orders    None     10:20 PM Patient with history of cholelithiasis diagnosed 2 months ago here with periumbilical and epigastric abdominal pain since  this morning.  She does not have any significant right upper quadrant tenderness or right lower quadrant tenderness.  Her labs are reassuring.  Given prior history of cholelithiasis, will obtain limited abdominal ultrasound to rule out acute cholecystitis.  Low suspicion for colitis or appendicitis at this time.  GI cocktail given.  11:22 PM No improvement with GI cocktail, will provide pain medication.  Limited abdominal ultrasound demonstrated cholelithiasis and positive Murphy sign without evidence of cholecystitis.  I did reach out to consult general surgery Dr. Donell Beers who  requested pain management and if pain is not well controlled then patient can be admitted for cholecystectomy.  Will provide pain medication.  At this time patient is afebrile, normal WBC.  11:46 PM I discussed care with patient, who states that she would prefer to be admitted for surgical intervention as she is having postprandial pain on a daily basis for the past month. Will consult Dr. Donell Beers.   12:11 AM Pt sign out to oncoming provider who will notify to Dr. Donell Beers patient's desire to be admitted for cholecystectomy.  Pt received pain medication but report minimal improvement of sxs. Screening COVID-19 test ordered.   Samantha Durham was evaluated in Emergency Department on 12/25/2019 for the symptoms described in the history of present illness. She was evaluated in the context of the global COVID-19 pandemic, which necessitated consideration that the patient might be at risk for infection with the SARS-CoV-2 virus that  causes COVID-19. Institutional protocols and algorithms that pertain to the evaluation of patients at risk for COVID-19 are in a state of rapid change based on information released by regulatory bodies including the CDC and federal and state organizations. These policies and algorithms were followed during the patient's care in the ED.    Fayrene Helper, PA-C 12/25/19 0012    Gwyneth Sprout, MD 12/25/19 2044

## 2019-12-25 ENCOUNTER — Observation Stay (HOSPITAL_COMMUNITY): Payer: Medicaid Other | Admitting: Certified Registered Nurse Anesthetist

## 2019-12-25 ENCOUNTER — Encounter (HOSPITAL_COMMUNITY)
Admission: EM | Disposition: A | Discharge status: Home / Self Care | Payer: Self-pay | Source: Home / Self Care | Attending: Emergency Medicine

## 2019-12-25 ENCOUNTER — Encounter (HOSPITAL_COMMUNITY): Payer: Self-pay

## 2019-12-25 DIAGNOSIS — Z20822 Contact with and (suspected) exposure to covid-19: Secondary | ICD-10-CM | POA: Diagnosis not present

## 2019-12-25 DIAGNOSIS — K8000 Calculus of gallbladder with acute cholecystitis without obstruction: Secondary | ICD-10-CM | POA: Diagnosis present

## 2019-12-25 DIAGNOSIS — Z87891 Personal history of nicotine dependence: Secondary | ICD-10-CM | POA: Diagnosis not present

## 2019-12-25 DIAGNOSIS — K8010 Calculus of gallbladder with chronic cholecystitis without obstruction: Secondary | ICD-10-CM | POA: Diagnosis not present

## 2019-12-25 HISTORY — PX: CHOLECYSTECTOMY: SHX55

## 2019-12-25 LAB — COMPREHENSIVE METABOLIC PANEL
ALT: 25 U/L (ref 0–44)
AST: 22 U/L (ref 15–41)
Albumin: 3.2 g/dL — ABNORMAL LOW (ref 3.5–5.0)
Alkaline Phosphatase: 41 U/L (ref 38–126)
Anion gap: 6 (ref 5–15)
BUN: 10 mg/dL (ref 6–20)
CO2: 25 mmol/L (ref 22–32)
Calcium: 8.4 mg/dL — ABNORMAL LOW (ref 8.9–10.3)
Chloride: 107 mmol/L (ref 98–111)
Creatinine, Ser: 0.59 mg/dL (ref 0.44–1.00)
GFR calc Af Amer: >60 mL/min (ref >60)
GFR calc non Af Amer: >60 mL/min (ref >60)
Glucose, Bld: 123 mg/dL — ABNORMAL HIGH (ref 70–99)
Potassium: 3.7 mmol/L (ref 3.5–5.1)
Sodium: 138 mmol/L (ref 135–145)
Total Bilirubin: 0.5 mg/dL (ref 0.3–1.2)
Total Protein: 5.5 g/dL — ABNORMAL LOW (ref 6.5–8.1)

## 2019-12-25 LAB — CBC
HCT: 38.1 % (ref 36.0–46.0)
Hemoglobin: 12.3 g/dL (ref 12.0–15.0)
MCH: 28.6 pg (ref 26.0–34.0)
MCHC: 32.3 g/dL (ref 30.0–36.0)
MCV: 88.6 fL (ref 80.0–100.0)
Platelets: 194 10*3/uL (ref 150–400)
RBC: 4.3 MIL/uL (ref 3.87–5.11)
RDW: 13 % (ref 11.5–15.5)
WBC: 4.3 10*3/uL (ref 4.0–10.5)
nRBC: 0 % (ref 0.0–0.2)

## 2019-12-25 LAB — SARS CORONAVIRUS 2 BY RT PCR (HOSPITAL ORDER, PERFORMED IN ~~LOC~~ HOSPITAL LAB): SARS Coronavirus 2: NEGATIVE

## 2019-12-25 LAB — HIV ANTIBODY (ROUTINE TESTING W REFLEX): HIV Screen 4th Generation wRfx: NONREACTIVE

## 2019-12-25 LAB — SURGICAL PCR SCREEN
MRSA, PCR: NEGATIVE
Staphylococcus aureus: NEGATIVE

## 2019-12-25 SURGERY — LAPAROSCOPIC CHOLECYSTECTOMY
Anesthesia: General | Site: Abdomen

## 2019-12-25 MED ORDER — BUPIVACAINE HCL 0.25 % IJ SOLN
INTRAMUSCULAR | Status: DC | PRN
  Administered 2019-12-25: 20 mL

## 2019-12-25 MED ORDER — ACETAMINOPHEN 10 MG/ML IV SOLN: 1000.0000 mg | Freq: Once | INTRAVENOUS | Status: DC | PRN

## 2019-12-25 MED ORDER — ONDANSETRON HCL 4 MG/2ML IJ SOLN
INTRAMUSCULAR | Status: AC
  Filled 2019-12-25: qty 2

## 2019-12-25 MED ORDER — BUPIVACAINE HCL (PF) 0.25 % IJ SOLN
INTRAMUSCULAR | Status: AC
  Filled 2019-12-25: qty 30

## 2019-12-25 MED ORDER — ACETAMINOPHEN 500 MG PO TABS
1000.0000 mg | ORAL_TABLET | Freq: Three times a day (TID) | ORAL | Status: DC
  Administered 2019-12-25 – 2019-12-26 (×2): 1000 mg via ORAL
  Filled 2019-12-25 (×2): qty 2

## 2019-12-25 MED ORDER — HYDROMORPHONE HCL 1 MG/ML IJ SOLN: 0.2500 mg | INTRAMUSCULAR | Status: DC | PRN

## 2019-12-25 MED ORDER — FENTANYL CITRATE (PF) 100 MCG/2ML IJ SOLN
INTRAMUSCULAR | Status: DC | PRN
  Administered 2019-12-25: 25 ug via INTRAVENOUS
  Administered 2019-12-25: 100 ug via INTRAVENOUS
  Administered 2019-12-25: 25 ug via INTRAVENOUS

## 2019-12-25 MED ORDER — EPHEDRINE SULFATE-NACL 50-0.9 MG/10ML-% IV SOSY
PREFILLED_SYRINGE | INTRAVENOUS | Status: DC | PRN
  Administered 2019-12-25: 10 mg via INTRAVENOUS

## 2019-12-25 MED ORDER — ONDANSETRON HCL 4 MG/2ML IJ SOLN
INTRAMUSCULAR | Status: DC | PRN
  Administered 2019-12-25: 4 mg via INTRAVENOUS

## 2019-12-25 MED ORDER — PROPOFOL 10 MG/ML IV BOLUS
INTRAVENOUS | Status: DC | PRN
  Administered 2019-12-25: 130 mg via INTRAVENOUS

## 2019-12-25 MED ORDER — LACTATED RINGERS IV SOLN: INTRAVENOUS | Status: DC

## 2019-12-25 MED ORDER — OXYCODONE HCL 5 MG PO TABS: 5.0000 mg | ORAL_TABLET | Freq: Once | ORAL | Status: DC | PRN

## 2019-12-25 MED ORDER — PROPOFOL 10 MG/ML IV BOLUS
INTRAVENOUS | Status: AC
  Filled 2019-12-25: qty 20

## 2019-12-25 MED ORDER — PROMETHAZINE HCL 25 MG/ML IJ SOLN: 6.2500 mg | INTRAMUSCULAR | Status: DC | PRN

## 2019-12-25 MED ORDER — DEXTROSE IN LACTATED RINGERS 5 % IV SOLN: INTRAVENOUS | Status: DC

## 2019-12-25 MED ORDER — DIPHENHYDRAMINE HCL 12.5 MG/5ML PO ELIX: 12.5000 mg | ORAL_SOLUTION | Freq: Four times a day (QID) | ORAL | Status: DC | PRN

## 2019-12-25 MED ORDER — ONDANSETRON HCL 4 MG/2ML IJ SOLN
4.0000 mg | Freq: Four times a day (QID) | INTRAMUSCULAR | Status: DC | PRN
  Administered 2019-12-25 (×2): 4 mg via INTRAVENOUS
  Filled 2019-12-25 (×2): qty 2

## 2019-12-25 MED ORDER — PHENYLEPHRINE 40 MCG/ML (10ML) SYRINGE FOR IV PUSH (FOR BLOOD PRESSURE SUPPORT)
PREFILLED_SYRINGE | INTRAVENOUS | Status: AC
  Filled 2019-12-25: qty 10

## 2019-12-25 MED ORDER — EPHEDRINE 5 MG/ML INJ
INTRAVENOUS | Status: AC
  Filled 2019-12-25: qty 10

## 2019-12-25 MED ORDER — ACETAMINOPHEN 325 MG PO TABS
650.0000 mg | ORAL_TABLET | Freq: Four times a day (QID) | ORAL | Status: DC | PRN
  Administered 2019-12-25 (×2): 650 mg via ORAL
  Filled 2019-12-25 (×2): qty 2

## 2019-12-25 MED ORDER — SUGAMMADEX SODIUM 200 MG/2ML IV SOLN
INTRAVENOUS | Status: DC | PRN
Start: 2019-12-25 — End: 2019-12-25
  Administered 2019-12-25: 200 mg via INTRAVENOUS

## 2019-12-25 MED ORDER — MORPHINE SULFATE (PF) 2 MG/ML IV SOLN
2.0000 mg | INTRAVENOUS | Status: DC | PRN
  Administered 2019-12-25 – 2019-12-26 (×3): 2 mg via INTRAVENOUS
  Filled 2019-12-25 (×3): qty 1

## 2019-12-25 MED ORDER — PHENYLEPHRINE 40 MCG/ML (10ML) SYRINGE FOR IV PUSH (FOR BLOOD PRESSURE SUPPORT)
PREFILLED_SYRINGE | INTRAVENOUS | Status: DC | PRN
  Administered 2019-12-25: 40 ug via INTRAVENOUS
  Administered 2019-12-25 (×3): 80 ug via INTRAVENOUS

## 2019-12-25 MED ORDER — HYDROMORPHONE HCL 1 MG/ML IJ SOLN
1.0000 mg | Freq: Once | INTRAMUSCULAR | Status: AC
  Administered 2019-12-25: 1 mg via INTRAVENOUS
  Filled 2019-12-25: qty 1

## 2019-12-25 MED ORDER — MIDAZOLAM HCL 5 MG/5ML IJ SOLN
INTRAMUSCULAR | Status: DC | PRN
  Administered 2019-12-25: 2 mg via INTRAVENOUS

## 2019-12-25 MED ORDER — OXYCODONE HCL 5 MG PO TABS: 5.0000 mg | ORAL_TABLET | ORAL | Status: DC | PRN

## 2019-12-25 MED ORDER — MUPIROCIN 2 % EX OINT: 1.0000 "application " | TOPICAL_OINTMENT | Freq: Two times a day (BID) | CUTANEOUS | Status: DC

## 2019-12-25 MED ORDER — SUCCINYLCHOLINE CHLORIDE 200 MG/10ML IV SOSY
PREFILLED_SYRINGE | INTRAVENOUS | Status: DC | PRN
  Administered 2019-12-25: 60 mg via INTRAVENOUS

## 2019-12-25 MED ORDER — ENOXAPARIN SODIUM 40 MG/0.4ML ~~LOC~~ SOLN
40.0000 mg | SUBCUTANEOUS | Status: DC
  Administered 2019-12-25: 40 mg via SUBCUTANEOUS
  Filled 2019-12-25: qty 0.4

## 2019-12-25 MED ORDER — ALBUMIN HUMAN 5 % IV SOLN: INTRAVENOUS | Status: DC | PRN

## 2019-12-25 MED ORDER — PROCHLORPERAZINE EDISYLATE 10 MG/2ML IJ SOLN
5.0000 mg | Freq: Four times a day (QID) | INTRAMUSCULAR | Status: DC | PRN
  Administered 2019-12-25: 5 mg via INTRAVENOUS
  Filled 2019-12-25: qty 2

## 2019-12-25 MED ORDER — ROCURONIUM BROMIDE 10 MG/ML (PF) SYRINGE
PREFILLED_SYRINGE | INTRAVENOUS | Status: AC
  Filled 2019-12-25: qty 20

## 2019-12-25 MED ORDER — OXYCODONE HCL 5 MG PO TABS
5.0000 mg | ORAL_TABLET | ORAL | Status: DC | PRN
  Administered 2019-12-25: 5 mg via ORAL
  Administered 2019-12-25 – 2019-12-26 (×2): 10 mg via ORAL
  Filled 2019-12-25 (×2): qty 2
  Filled 2019-12-25: qty 1

## 2019-12-25 MED ORDER — DEXAMETHASONE SODIUM PHOSPHATE 10 MG/ML IJ SOLN
INTRAMUSCULAR | Status: AC
  Filled 2019-12-25: qty 1

## 2019-12-25 MED ORDER — OXYCODONE HCL 5 MG/5ML PO SOLN: 5.0000 mg | Freq: Once | ORAL | Status: DC | PRN

## 2019-12-25 MED ORDER — MIDAZOLAM HCL 2 MG/2ML IJ SOLN
INTRAMUSCULAR | Status: AC
  Filled 2019-12-25: qty 2

## 2019-12-25 MED ORDER — DEXAMETHASONE SODIUM PHOSPHATE 10 MG/ML IJ SOLN
INTRAMUSCULAR | Status: DC | PRN
Start: 2019-12-25 — End: 2019-12-25
  Administered 2019-12-25: 10 mg via INTRAVENOUS

## 2019-12-25 MED ORDER — SENNA 8.6 MG PO TABS
1.0000 | ORAL_TABLET | Freq: Two times a day (BID) | ORAL | Status: DC
  Administered 2019-12-25 – 2019-12-26 (×2): 8.6 mg via ORAL
  Filled 2019-12-25 (×3): qty 1

## 2019-12-25 MED ORDER — TRAMADOL HCL 50 MG PO TABS: 50.0000 mg | ORAL_TABLET | Freq: Four times a day (QID) | ORAL | Status: DC | PRN

## 2019-12-25 MED ORDER — ROCURONIUM BROMIDE 10 MG/ML (PF) SYRINGE
PREFILLED_SYRINGE | INTRAVENOUS | Status: DC | PRN
  Administered 2019-12-25: 10 mg via INTRAVENOUS
  Administered 2019-12-25: 50 mg via INTRAVENOUS

## 2019-12-25 MED ORDER — LIDOCAINE 2% (20 MG/ML) 5 ML SYRINGE
INTRAMUSCULAR | Status: DC | PRN
  Administered 2019-12-25: 80 mg via INTRAVENOUS

## 2019-12-25 MED ORDER — HYDROMORPHONE HCL 1 MG/ML IJ SOLN
0.5000 mg | INTRAMUSCULAR | Status: DC | PRN
  Administered 2019-12-25: 1 mg via INTRAVENOUS
  Filled 2019-12-25: qty 1

## 2019-12-25 MED ORDER — PROCHLORPERAZINE MALEATE 10 MG PO TABS
10.0000 mg | ORAL_TABLET | Freq: Four times a day (QID) | ORAL | Status: DC | PRN
  Filled 2019-12-25: qty 1

## 2019-12-25 MED ORDER — ACETAMINOPHEN 650 MG RE SUPP: 650.0000 mg | Freq: Four times a day (QID) | RECTAL | Status: DC | PRN

## 2019-12-25 MED ORDER — IBUPROFEN 200 MG PO TABS: 600.0000 mg | ORAL_TABLET | Freq: Four times a day (QID) | ORAL | Status: DC | PRN

## 2019-12-25 MED ORDER — SODIUM CHLORIDE 0.9 % IV SOLN
2.0000 g | INTRAVENOUS | Status: DC
  Administered 2019-12-25: 2 g via INTRAVENOUS
  Filled 2019-12-25: qty 20

## 2019-12-25 MED ORDER — ONDANSETRON 4 MG PO TBDP: 4.0000 mg | ORAL_TABLET | Freq: Four times a day (QID) | ORAL | Status: DC | PRN

## 2019-12-25 MED ORDER — DIPHENHYDRAMINE HCL 50 MG/ML IJ SOLN: 12.5000 mg | Freq: Four times a day (QID) | INTRAMUSCULAR | Status: DC | PRN

## 2019-12-25 MED ORDER — GLYCOPYRROLATE PF 0.2 MG/ML IJ SOSY
PREFILLED_SYRINGE | INTRAMUSCULAR | Status: AC
  Filled 2019-12-25: qty 2

## 2019-12-25 MED ORDER — SODIUM CHLORIDE 0.9 % IR SOLN
Status: DC | PRN
  Administered 2019-12-25: 1000 mL

## 2019-12-25 MED ORDER — LIDOCAINE 2% (20 MG/ML) 5 ML SYRINGE
INTRAMUSCULAR | Status: AC
  Filled 2019-12-25: qty 5

## 2019-12-25 MED ORDER — GLYCOPYRROLATE PF 0.2 MG/ML IJ SOSY
PREFILLED_SYRINGE | INTRAMUSCULAR | Status: DC | PRN
  Administered 2019-12-25: .2 mg via INTRAVENOUS

## 2019-12-25 MED ORDER — 0.9 % SODIUM CHLORIDE (POUR BTL) OPTIME
TOPICAL | Status: DC | PRN
  Administered 2019-12-25: 1000 mL

## 2019-12-25 MED ORDER — MEPERIDINE HCL 25 MG/ML IJ SOLN: 6.2500 mg | INTRAMUSCULAR | Status: DC | PRN

## 2019-12-25 MED ORDER — METHOCARBAMOL 500 MG PO TABS
500.0000 mg | ORAL_TABLET | Freq: Four times a day (QID) | ORAL | Status: DC | PRN
  Administered 2019-12-26: 500 mg via ORAL
  Filled 2019-12-25: qty 1

## 2019-12-25 MED ORDER — FENTANYL CITRATE (PF) 250 MCG/5ML IJ SOLN
INTRAMUSCULAR | Status: AC
  Filled 2019-12-25: qty 5

## 2019-12-25 MED ORDER — DEXMEDETOMIDINE HCL IN NACL 200 MCG/50ML IV SOLN
INTRAVENOUS | Status: DC | PRN
  Administered 2019-12-25: 8 ug via INTRAVENOUS
  Administered 2019-12-25: 12 ug via INTRAVENOUS
  Administered 2019-12-25: 8 ug via INTRAVENOUS

## 2019-12-25 SURGICAL SUPPLY — 36 items
APPLIER CLIP 5 13 M/L LIGAMAX5 (MISCELLANEOUS) ×3
BLADE CLIPPER SURG (BLADE) IMPLANT
CANISTER SUCT 3000ML PPV (MISCELLANEOUS) ×3 IMPLANT
CHLORAPREP W/TINT 26 (MISCELLANEOUS) ×3 IMPLANT
CLIP APPLIE 5 13 M/L LIGAMAX5 (MISCELLANEOUS) ×1 IMPLANT
COVER SURGICAL LIGHT HANDLE (MISCELLANEOUS) ×3 IMPLANT
COVER WAND RF STERILE (DRAPES) ×3 IMPLANT
DERMABOND ADVANCED (GAUZE/BANDAGES/DRESSINGS) ×2
DERMABOND ADVANCED .7 DNX12 (GAUZE/BANDAGES/DRESSINGS) ×1 IMPLANT
ELECT REM PT RETURN 9FT ADLT (ELECTROSURGICAL) ×3
ELECTRODE REM PT RTRN 9FT ADLT (ELECTROSURGICAL) ×1 IMPLANT
GLOVE BIO SURGEON STRL SZ 6 (GLOVE) ×3 IMPLANT
GLOVE INDICATOR 6.5 STRL GRN (GLOVE) ×3 IMPLANT
GOWN STRL REUS W/ TWL LRG LVL3 (GOWN DISPOSABLE) ×3 IMPLANT
GOWN STRL REUS W/TWL LRG LVL3 (GOWN DISPOSABLE) ×6
GRASPER SUT TROCAR 14GX15 (MISCELLANEOUS) ×3 IMPLANT
HEMOSTAT SNOW SURGICEL 2X4 (HEMOSTASIS) ×2 IMPLANT
KIT BASIN OR (CUSTOM PROCEDURE TRAY) ×3 IMPLANT
KIT TURNOVER KIT B (KITS) ×3 IMPLANT
NDL INSUFFLATION 14GA 120MM (NEEDLE) ×1 IMPLANT
NEEDLE INSUFFLATION 14GA 120MM (NEEDLE) ×3 IMPLANT
NS IRRIG 1000ML POUR BTL (IV SOLUTION) ×3 IMPLANT
PAD ARMBOARD 7.5X6 YLW CONV (MISCELLANEOUS) ×3 IMPLANT
POUCH SPECIMEN RETRIEVAL 10MM (ENDOMECHANICALS) ×3 IMPLANT
SCISSORS LAP 5X35 DISP (ENDOMECHANICALS) ×3 IMPLANT
SET IRRIG TUBING LAPAROSCOPIC (IRRIGATION / IRRIGATOR) ×3 IMPLANT
SET TUBE SMOKE EVAC HIGH FLOW (TUBING) ×3 IMPLANT
SLEEVE ENDOPATH XCEL 5M (ENDOMECHANICALS) ×6 IMPLANT
SPECIMEN JAR SMALL (MISCELLANEOUS) ×3 IMPLANT
SUT MNCRL AB 4-0 PS2 18 (SUTURE) ×3 IMPLANT
TOWEL GREEN STERILE (TOWEL DISPOSABLE) IMPLANT
TOWEL GREEN STERILE FF (TOWEL DISPOSABLE) ×3 IMPLANT
TRAY LAPAROSCOPIC MC (CUSTOM PROCEDURE TRAY) ×3 IMPLANT
TROCAR XCEL NON-BLD 11X100MML (ENDOMECHANICALS) ×3 IMPLANT
TROCAR XCEL NON-BLD 5MMX100MML (ENDOMECHANICALS) ×3 IMPLANT
WATER STERILE IRR 1000ML POUR (IV SOLUTION) ×3 IMPLANT

## 2019-12-25 NOTE — Transfer of Care (Signed)
Immediate Anesthesia Transfer of Care Note  Patient: Samantha Durham  Procedure(s) Performed: LAPAROSCOPIC CHOLECYSTECTOMY (N/A Abdomen)  Patient Location: PACU  Anesthesia Type:General  Level of Consciousness: drowsy  Airway & Oxygen Therapy: Patient Spontanous Breathing and Patient connected to face mask oxygen  Post-op Assessment: Report given to RN and Post -op Vital signs reviewed and stable  Post vital signs: Reviewed and stable  Last Vitals:  Vitals Value Taken Time  BP    Temp    Pulse 90 12/25/19 1304  Resp 15 12/25/19 1304  SpO2 100 % 12/25/19 1304  Vitals shown include unvalidated device data.  Last Pain:  Vitals:   12/25/19 0812  TempSrc: Oral  PainSc:       Patients Stated Pain Goal: 2 (12/25/19 0241)  Complications: No apparent anesthesia complications

## 2019-12-25 NOTE — Progress Notes (Signed)
PT BP 88/45 MAP 58, RN rechecked BP 94/48 MAP 61. Pt's primary fluids started and BP rechecked 1 hour later with a BP of 90/52 MAP 64. MD notified. No new orders received. Will continue to monitor.

## 2019-12-25 NOTE — Anesthesia Procedure Notes (Signed)
Procedure Name: Intubation Date/Time: 12/25/2019 11:31 AM Performed by: Marny Lowenstein, CRNA Pre-anesthesia Checklist: Patient identified, Emergency Drugs available, Suction available and Patient being monitored Patient Re-evaluated:Patient Re-evaluated prior to induction Oxygen Delivery Method: Circle System Utilized Preoxygenation: Pre-oxygenation with 100% oxygen Induction Type: IV induction, Rapid sequence and Cricoid Pressure applied Laryngoscope Size: Miller and 2 Grade View: Grade I Tube type: Oral Tube size: 7.0 mm Number of attempts: 1 Airway Equipment and Method: Stylet Placement Confirmation: ETT inserted through vocal cords under direct vision,  positive ETCO2 and breath sounds checked- equal and bilateral Secured at: 22 cm Tube secured with: Tape Dental Injury: Teeth and Oropharynx as per pre-operative assessment

## 2019-12-25 NOTE — Op Note (Signed)
Operative Note  Adilynn Bessey y.o. female [id number]  12/25/2019  Surgeon: Berna Bue MD FACS  Procedure performed: Laparoscopic Cholecystectomy  Procedure classification: emergent  Preop diagnosis: acute cholecystitis Post-op diagnosis/intraop findings: same  Specimens: gallbladder  Retained items: none  EBL: minimal  Complications: none  Description of procedure: After obtaining informed consent the patient was brought to the operating room. Antibiotics had been administered. SCD's were applied. General endotracheal anesthesia was initiated and a formal time-out was performed. The abdomen was prepped and draped in the usual sterile fashion and the abdomen was entered using an infraumbilical veress needle after instilling the site with local. Insufflation to was obtained, 34mm trocar and camera inserted, and gross inspection revealed no evidence of injury from our entry or other intraabdominal abnormalities. Two 49mm trocars were introduced in the right midclavicular and right anterior axillary lines under direct visualization and following infiltration with local. An 46mm trocar was placed in the epigastrium. Omental adhesions to the gallbladder were divided bluntly and with cautery where needed. The gallbladder was retracted cephalad and the infundibulum was retracted laterally. A combination of hook electrocautery and blunt dissection was utilized to clear the peritoneum from the neck and cystic duct, circumferentially isolating the cystic artery and cystic duct and lifting the gallbladder from the cystic plate. The cystic artery was relatively large while the cystic duct was quite diminutive. The critical view of safety was achieved with the cystic artery, cystic duct, and liver bed visualized between them with no other structures. The junction of the cystic duct and common bile duct was also visible and was protected. The artery was clipped with a two clips proximally and one  distally and divided as was the cystic duct with three clips on the proximal end. The gallbladder was dissected from the liver plate using electrocautery. Once freed the gallbladder was placed in an endocatch bag and removed through the epigastric trocar site. A small amount of bleeding on the liver bed was controlled with cautery. Some bile had been spilled from the gallbladder during its dissection from the liver bed. This was aspirated and the right upper quadrant was irrigated copiously until the effluent was clear. Hemostasis was once again confirmed, and reinspection of the abdomen revealed no injuries. The clips were well opposed without any bile leak from the duct or the liver bed. Surgicel Jamelle Haring was placed in the liver fossa. The 71mm trocar site in the epigastrium was closed with a 0 vicryl in the fascia under direct visualization using a PMI device. The abdomen was desufflated and all trocars removed. The skin incisions were closed with subcuticular monocryl and Dermabond. The patient was awakened, extubated and transported to the recovery room in stable condition.    All counts were correct at the completion of the case.

## 2019-12-25 NOTE — Progress Notes (Signed)
Unrelieved nausea and epigastric pain (10/10), PA rounding and changed PRN pain medication to Morphine 2mg  IV. Will administer pain med and Compazine for nausea and re-evaluate for effectiveness.

## 2019-12-25 NOTE — Progress Notes (Signed)
Pt arrived back on the unit from PACU. She is alert and reports pain 9/10 and grimacing in pain so Dilaudid IV PRN given. She is also nauseous so I administered Zofran IV. BP remains soft. Small puncture sites x4 with skin glue to abdominal area. Pt left in bed, belongings within reach, bed locked and in lowest position.  Report received from [facility], PACU RN.

## 2019-12-25 NOTE — Anesthesia Preprocedure Evaluation (Addendum)
Anesthesia Evaluation  Patient identified by MRN, date of birth, ID band  Reviewed: Allergy & Precautions, NPO status , Patient's Chart, lab work & pertinent test results  Airway Mallampati: II  TM Distance: >3 FB Neck ROM: Full    Dental no notable dental hx. (+) Teeth Intact, Dental Advisory Given   Pulmonary neg pulmonary ROS, former smoker,    Pulmonary exam normal breath sounds clear to auscultation       Cardiovascular negative cardio ROS Normal cardiovascular exam Rhythm:Regular Rate:Normal     Neuro/Psych negative neurological ROS  negative psych ROS   GI/Hepatic negative GI ROS, Neg liver ROS,   Endo/Other  negative endocrine ROS  Renal/GU K+ 3.7     Musculoskeletal negative musculoskeletal ROS (+)   Abdominal   Peds  Hematology Hgb 12.3   Anesthesia Other Findings   Reproductive/Obstetrics negative OB ROS                            Anesthesia Physical Anesthesia Plan  ASA: I  Anesthesia Plan: General   Post-op Pain Management:    Induction: Intravenous  PONV Risk Score and Plan: 4 or greater and Treatment may vary due to age or medical condition, Ondansetron, Dexamethasone and Midazolam  Airway Management Planned: Oral ETT  Additional Equipment: None  Intra-op Plan:   Post-operative Plan: Extubation in OR  Informed Consent: I have reviewed the patients History and Physical, chart, labs and discussed the procedure including the risks, benefits and alternatives for the proposed anesthesia with the patient or authorized representative who has indicated his/her understanding and acceptance.     Dental advisory given  Plan Discussed with:   Anesthesia Plan Comments: (GA  )       Anesthesia Quick Evaluation

## 2019-12-25 NOTE — Anesthesia Postprocedure Evaluation (Signed)
Anesthesia Post Note  Patient: Samantha Durham  Procedure(s) Performed: LAPAROSCOPIC CHOLECYSTECTOMY (N/A Abdomen)     Patient location during evaluation: PACU Anesthesia Type: General Level of consciousness: awake and alert Pain management: pain level controlled Vital Signs Assessment: post-procedure vital signs reviewed and stable Respiratory status: spontaneous breathing, nonlabored ventilation, respiratory function stable and patient connected to nasal cannula oxygen Cardiovascular status: blood pressure returned to baseline and stable Postop Assessment: no apparent nausea or vomiting Anesthetic complications: no    Last Vitals:  Vitals:   12/25/19 1319 12/25/19 1339  BP: (!) 92/51 (!) 89/53  Pulse: 68 67  Resp: 18 14  Temp: 36.7 C 36.8 C  SpO2: 97% 99%    Last Pain:  Vitals:   12/25/19 1339  TempSrc: Oral  PainSc:                  Trevor Iha

## 2019-12-25 NOTE — Progress Notes (Signed)
   12/25/19 0812  Assess: MEWS Score  Temp 97.6 F (36.4 C)  BP (!) 88/56  Pulse Rate 80  Resp 13  SpO2 100 %  O2 Device Room Air   Pt will low BP resulting in a MEWS score of 2. This has been consistent since her admission to the hospital, her only complaint is of a headache and she requested Tylenol PRN. She stated her BP normally runs low 90s even at home, therefore, will continue to monitor.

## 2019-12-25 NOTE — Progress Notes (Signed)
Pt arrived to unit from [facility]. Pt able to ambulate from stretcher to bed with minimal assist. VS stable, CHG and initial skin assessment performed. Pt was educated on room and equipment. Pt currently lying in bed, in lowest position and call bell in reach. Will continue to monitor.

## 2019-12-25 NOTE — Progress Notes (Signed)
      Subjective/Chief Complaint: Pain persists along with nausea, slightly better than on presentation   Objective: Vital signs in last 24 hours: Temp:  [98 F (36.7 C)-98.8 F (37.1 C)] 98.3 F (36.8 C) (06/03 0409) Pulse Rate:  [60-90] 60 (06/03 0409) Resp:  [16-18] 16 (06/03 0241) BP: (88-105)/(45-65) 90/52 (06/03 0538) SpO2:  [93 %-100 %] 100 % (06/03 0241)    Intake/Output from previous day: 06/02 0701 - 06/03 0700 In: 849.3 [IV Piggyback:849.3] Out: -  Intake/Output this shift: No intake/output data recorded.  General appearance: alert and cooperative  Lab Results:  Recent Labs    12/24/19 1925 12/25/19 0659  WBC 5.4 4.3  HGB 13.7 12.3  HCT 41.6 38.1  PLT 213 194   BMET Recent Labs    12/24/19 1925  NA 140  K 3.4*  CL 107  CO2 25  GLUCOSE 94  BUN 10  CREATININE 0.64  CALCIUM 8.9   PT/INR No results for input(s): LABPROT, INR in the last 72 hours. ABG No results for input(s): PHART, HCO3 in the last 72 hours.  Invalid input(s): PCO2, PO2  Studies/Results: US Abdomen Limited  Result Date: 12/24/2019 CLINICAL DATA:  Right upper quadrant pain for 1 day EXAM: ULTRASOUND ABDOMEN LIMITED RIGHT UPPER QUADRANT COMPARISON:  None. FINDINGS: Gallbladder: Multiple layering gallstones are present. There is a positive sonographic Murphy sign. The gallbladder wall thickness measures up to 2.8 Mm. No pericholecystic fluid. Common bile duct: Diameter: 8 mm Liver: No focal lesion identified. Within normal limits in parenchymal echogenicity. Portal vein is patent on color Doppler imaging with normal direction of blood flow towards the liver. Other: None. IMPRESSION: Cholelithiasis with a positive sonographic Murphy sign. No other definite evidence of acute cholecystitis. Electronically Signed   By: Jonna Clark M.D.   On: 12/24/2019 23:01    Anti-infectives: Anti-infectives (From admission, onward)   Start     Dose/Rate Route Frequency Ordered Stop   12/25/19 0330   cefTRIAXone (ROCEPHIN) 2 g in sodium chloride 0.9 % 100 mL IVPB     2 g 200 mL/hr over 30 Minutes Intravenous Every 24 hours 12/25/19 0238        Assessment/Plan: Acute cholecystitis. I recommend proceeding with laparoscopic cholecystectomy. Patient expressed understanding of procedure, risks. Questions welcomed and answered. Will proceed to OR later this morning. Possible discharge this afternoon pending intraop and post-op course.  LOS: 0 days    Berna Bue 12/25/2019

## 2019-12-25 NOTE — Discharge Instructions (Signed)
[facility] ° °Please arrive at least 30 min before your appointment to complete your check in paperwork.  If you are unable to arrive 30 min prior to your appointment time we may have to cancel or reschedule you. °LAPAROSCOPIC SURGERY: POST OP INSTRUCTIONS °Always review your discharge instruction sheet given to you by the facility where your surgery was performed. °IF YOU HAVE DISABILITY OR FAMILY LEAVE FORMS, YOU MUST BRING THEM TO THE OFFICE FOR PROCESSING.   °DO NOT GIVE THEM TO YOUR DOCTOR. ° °PAIN CONTROL ° °1. First take acetaminophen (Tylenol) AND/or ibuprofen (Advil) to control your pain after surgery.  Follow directions on package.  Taking acetaminophen (Tylenol) and/or ibuprofen (Advil) regularly after surgery will help to control your pain and lower the amount of prescription pain medication you may need.  You should not take more than 4,000 mg (4 grams) of acetaminophen (Tylenol) in 24 hours.  You should not take ibuprofen (Advil), aleve, motrin, naprosyn or other NSAIDS if you have a history of stomach ulcers or chronic kidney disease.  °2. A prescription for pain medication may be given to you upon discharge.  Take your pain medication as prescribed, if you still have uncontrolled pain after taking acetaminophen (Tylenol) or ibuprofen (Advil). °3. Use ice packs to help control pain. °4. If you need a refill on your pain medication, please contact your pharmacy.  They will contact our office to request authorization. Prescriptions will not be filled after 5pm or on week-ends. ° °HOME MEDICATIONS °5. Take your usually prescribed medications unless otherwise directed. ° °DIET °6. You should follow a light diet the first few days after arrival home.  Be sure to include lots of fluids daily. Avoid fatty, fried foods.  ° °CONSTIPATION °7. It is common to experience some constipation after surgery and if you are taking pain medication.  Increasing fluid intake and taking a stool  softener (such as Colace) will usually help or prevent this problem from occurring.  A mild laxative (Milk of Magnesia or Miralax) should be taken according to package instructions if there are no bowel movements after 48 hours. ° °WOUND/INCISION CARE °8. Most patients will experience some swelling and bruising in the area of the incisions.  Ice packs will help.  Swelling and bruising can take several days to resolve.  °9. Unless discharge instructions indicate otherwise, follow guidelines below  °a. STERI-STRIPS - you may remove your outer bandages 48 hours after surgery, and you may shower at that time.  You have steri-strips (small skin tapes) in place directly over the incision.  These strips should be left on the skin for 7-10 days.   °b. DERMABOND/SKIN GLUE - you may shower in 24 hours.  The glue will flake off over the next 2-3 weeks. °10. Any sutures or staples will be removed at the office during your follow-up visit. ° °ACTIVITIES °11. You may resume regular (light) daily activities beginning the next day--such as daily self-care, walking, climbing stairs--gradually increasing activities as tolerated.  You may have sexual intercourse when it is comfortable.  Refrain from any heavy lifting or straining until approved by your doctor. °a. You may drive when you are no longer taking prescription pain medication, you can comfortably wear a seatbelt, and you can safely maneuver your car and apply brakes. ° °FOLLOW-UP °12. You should see your doctor in the office for a follow-up appointment approximately 2-3 weeks after your surgery.  You should have been given your post-op/follow-up appointment when   your surgery was scheduled.  If you did not receive a post-op/follow-up appointment, make sure that you call for this appointment within a day or two after you arrive home to insure a convenient appointment time. ° °OTHER INSTRUCTIONS ° °WHEN TO CALL YOUR DOCTOR: °1. Fever over 101.0 °2. Inability to  urinate °3. Continued bleeding from incision. °4. Increased pain, redness, or drainage from the incision. °5. Increasing abdominal pain ° °The clinic staff is available to answer your questions during regular business hours.  Please don’t hesitate to call and ask to speak to one of the nurses for clinical concerns.  If you have a medical emergency, go to the nearest emergency room or call 911.  A surgeon from [facility] is always on call at the hospital. °[address], [address], [place], [place]  [address] ? [address] [address], , [place]   [address] °[phone] ? [phone] ? FAX [phone] ° ° ° °

## 2019-12-25 NOTE — Progress Notes (Signed)
Report given to short day stay RN. Re-checked BP 91/51 (65), HR-68, pt voided in bathroom, pt off unit.

## 2019-12-25 NOTE — H&P (Signed)
Samantha Durham is an 34 y.o. female.   Chief Complaint: abdominal pain.   HPI:  Pt is a lovely 34 yo F who came to the ED with recurrent epigastric abdominal pain. This has been going on for around 3 months.  The pain has been mild to moderate and intermittent. She was seen at urgent care in march and was dx with constipation.  The pains became more frequent and increased in severity.  She came to ED in April and CT showed gallstones. She previously had been able to get symptoms to go away with tylenol.  However, this time, multiple doses did not do anything to relieve the pain and she started throwing up. The pain worsened and she returned to the Ed.  Ultrasound showed no imaging signs of cholecystitis, but she had a positive sonographic murphy's sign.  Morphine seemed initially to help with the pain, but the pain is back and minimally different. She denies fever/chills/jaundice.    She is currently a stay at home mom of a 47 and 83 year old.  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    Past Medical History:  Diagnosis Date  . Hx of varicella     Past Surgical History:  Procedure Laterality Date  . CESAREAN SECTION N/A 01/21/2016   Procedure: CESAREAN SECTION;  Surgeon: Osborn Coho, MD;  Location: [facility];  Service: Obstetrics;  Laterality: N/A;         Family History: Many family members with gallstones.    Social History:  reports that she quit smoking about 8 years ago. She has never used smokeless tobacco. She reports that she does not drink alcohol or use drugs.  Allergies: No Known Allergies  Meds: Current Meds  Medication Sig  . acetaminophen (TYLENOL) 500 MG tablet Take 1,000 mg by mouth daily as needed.     Results for orders placed or performed during the hospital encounter of 12/24/19 (from the past 48 hour(s))  Urinalysis, Routine w reflex microscopic     Status: None   Collection Time: 12/24/19  7:23 PM  Result Value  Ref Range   Color, Urine YELLOW YELLOW   APPearance CLEAR CLEAR   Specific Gravity, Urine 1.026 1.005 - 1.030   pH 7.0 5.0 - 8.0   Glucose, UA NEGATIVE NEGATIVE mg/dL   Hgb urine dipstick NEGATIVE NEGATIVE   Bilirubin Urine NEGATIVE NEGATIVE   Ketones, ur NEGATIVE NEGATIVE mg/dL   Protein, ur NEGATIVE NEGATIVE mg/dL   Nitrite NEGATIVE NEGATIVE   Leukocytes,Ua NEGATIVE NEGATIVE    Comment: Performed at [facility], [address], [place], [place] [address]  Lipase, blood     Status: None   Collection Time: 12/24/19  7:25 PM  Result Value Ref Range   Lipase 25 11 - 51 U/L    Comment: Performed at [facility], [address], [place], [place] [address]  Comprehensive metabolic panel     Status: Abnormal   Collection Time: 12/24/19  7:25 PM  Result Value Ref Range   Sodium 140 135 - 145 mmol/L   Potassium 3.4 (L) 3.5 - 5.1 mmol/L   Chloride 107 98 - 111 mmol/L   CO2 25 22 - 32 mmol/L   Glucose, Bld 94 70 - 99 mg/dL    Comment: Glucose reference range applies only to samples taken after fasting for at least 8 hours.   BUN 10 6 - 20 mg/dL   Creatinine, Ser 8.65  0.44 - 1.00 mg/dL   Calcium 8.9 8.9 - 10.3 mg/dL   Total Protein 6.6 6.5 - 8.1 g/dL   Albumin 3.8 3.5 - 5.0 g/dL   AST 26 15 - 41 U/L   ALT 29 0 - 44 U/L   Alkaline Phosphatase 54 38 - 126 U/L   Total Bilirubin 0.5 0.3 - 1.2 mg/dL   GFR calc non Af Amer >60 >60 mL/min   GFR calc Af Amer >60 >60 mL/min   Anion gap 8 5 - 15    Comment: Performed at [facility] [address] [place], [place] [address]  CBC     Status: None   Collection Time: 12/24/19  7:25 PM  Result Value Ref Range   WBC 5.4 4.0 - 10.5 K/uL   RBC 4.73 3.87 - 5.11 MIL/uL   Hemoglobin 13.7 12.0 - 15.0 g/dL   HCT 41.6 36.0 - 46.0 %   MCV 87.9 80.0 - 100.0 fL   MCH 29.0 26.0 - 34.0 pg   MCHC 32.9 30.0 - 36.0 g/dL   RDW 12.8 11.5 - 15.5 %   Platelets 213 150 - 400 K/uL   nRBC 0.0 0.0 - 0.2 %    Comment: Performed at [facility], [place] [address], [place], [place] [address]  I-Stat beta hCG blood, ED     Status: None   Collection Time: 12/24/19  7:36 PM  Result Value Ref Range   I-stat hCG, quantitative <5.0 <5 mIU/mL   Comment 3            Comment:   GEST. AGE      CONC.  (mIU/mL)   <=1 WEEK        5 - 50     2 WEEKS       50 - 500     3 WEEKS       100 - 10,000     4 WEEKS     1,000 - 30,000        FEMALE AND NON-PREGNANT FEMALE:     LESS THAN 5 mIU/mL   SARS Coronavirus 2 by RT PCR (hospital order, performed in [place] hospital lab) Nasopharyngeal Nasopharyngeal Swab     Status: None   Collection Time: 12/24/19 11:52 PM   Specimen: Nasopharyngeal Swab  Result Value Ref Range   SARS Coronavirus 2 NEGATIVE NEGATIVE    Comment: (NOTE) SARS-CoV-2 target nucleic acids are NOT DETECTED. The SARS-CoV-2 RNA is generally detectable in upper and lower respiratory specimens during the acute phase of infection. The lowest concentration of SARS-CoV-2 viral copies this assay can detect is 250 copies / mL. A negative result does not preclude SARS-CoV-2 infection and should not be used as the sole basis for treatment or other patient management decisions.  A negative result may occur with improper specimen collection / handling, submission of specimen other than nasopharyngeal swab, presence of viral mutation(s) within the areas targeted by this assay, and inadequate number of viral copies (<250 copies / mL). A negative result must be combined with clinical observations, patient history, and epidemiological information. Fact Sheet for Patients:   StrictlyIdeas.no Fact Sheet for Healthcare Providers: BankingDealers.co.za This test is not yet approved or cleared  by the [place] FDA and has been authorized for detection and/or diagnosis of SARS-CoV-2 by FDA under an Emergency Use Authorization (EUA).  This EUA will remain in effect (meaning this test can be  used) for the duration of the  COVID-19 declaration under Section 564(b)(1) of the Act, 21 U.S.C. section 360bbb-3(b)(1), unless the authorization is terminated or revoked sooner. Performed at [facility], [address], [place], [place] [address]    US Abdomen Limited  Result Date: 12/24/2019 CLINICAL DATA:  Right upper quadrant pain for 1 day EXAM: ULTRASOUND ABDOMEN LIMITED RIGHT UPPER QUADRANT COMPARISON:  None. FINDINGS: Gallbladder: Multiple layering gallstones are present. There is a positive sonographic Murphy sign. The gallbladder wall thickness measures up to 2.8 Mm. No pericholecystic fluid. Common bile duct: Diameter: 8 mm Liver: No focal lesion identified. Within normal limits in parenchymal echogenicity. Portal vein is patent on color Doppler imaging with normal direction of blood flow towards the liver. Other: None. IMPRESSION: Cholelithiasis with a positive sonographic Murphy sign. No other definite evidence of acute cholecystitis. Electronically Signed   By: Jonna Clark M.D.   On: 12/24/2019 23:01    Review of Systems  Constitutional: Negative.   HENT: Negative.   Eyes: Negative.   Respiratory: Negative.   Cardiovascular: Negative.   Gastrointestinal: Positive for abdominal pain, constipation, nausea and vomiting.  Endocrine: Negative.   Genitourinary: Negative.   Musculoskeletal: Negative.   Skin: Negative.   Allergic/Immunologic: Negative.   Neurological: Negative.   Hematological: Negative.   Psychiatric/Behavioral: Negative.     Blood pressure (!) 105/56, pulse 88, temperature 98.8 F (37.1 C), temperature source Oral, resp. rate 18, SpO2 100 %, unknown if currently breastfeeding. Physical Exam  Constitutional: She is oriented to person, place, and time. She appears well-developed and well-nourished. No distress.  HENT:  Head: Normocephalic and atraumatic.  Right Ear: External ear normal.  Left Ear: External ear normal.  Eyes: Pupils are equal, round,  and reactive to light. Conjunctivae are normal. Right eye exhibits no discharge. Left eye exhibits no discharge. No scleral icterus.  Neck: No tracheal deviation present. No thyromegaly present.  Cardiovascular: Normal rate, regular rhythm, normal heart sounds and intact distal pulses.  Respiratory: Effort normal and breath sounds normal. No respiratory distress. She has no wheezes. She has no rales. She exhibits no tenderness.  GI: Soft. Bowel sounds are normal. She exhibits no distension and no mass. There is abdominal tenderness (mild epigastric tenderness.). There is no rebound and no guarding.  Musculoskeletal:        General: No tenderness, deformity or edema. Normal range of motion.     Cervical back: Normal range of motion.  Neurological: She is alert and oriented to person, place, and time. Coordination normal.  Skin: Skin is warm and dry. No rash noted. She is not diaphoretic. No erythema. No pallor.  Psychiatric: She has a normal mood and affect. Her behavior is normal. Judgment and thought content normal.     Assessment/Plan Mild early acute cholecystitis. NPO IV Fluids IV antibiotics OR later today for lap chole with Dr. Fredricka Bonine Reviewed surgery including incisions, risks, post op expectations and recovery/restrictions.    Almond Lint, MD 12/25/2019, 1:38 AM

## 2019-12-26 LAB — SURGICAL PATHOLOGY

## 2019-12-26 MED ORDER — ACETAMINOPHEN 500 MG PO TABS: 1000.0000 mg | ORAL_TABLET | Freq: Three times a day (TID) | ORAL | 0 refills | Status: AC | PRN

## 2019-12-26 MED ORDER — IBUPROFEN 600 MG PO TABS: 600.0000 mg | ORAL_TABLET | Freq: Four times a day (QID) | ORAL | 0 refills | Status: AC | PRN

## 2019-12-26 MED ORDER — OXYCODONE HCL 5 MG PO TABS: 5.0000 mg | ORAL_TABLET | Freq: Four times a day (QID) | ORAL | 0 refills | Status: AC | PRN

## 2019-12-26 MED ORDER — POLYETHYLENE GLYCOL 3350 17 G PO PACK: 17.0000 g | PACK | Freq: Every day | ORAL | 0 refills | Status: AC | PRN

## 2019-12-26 NOTE — Plan of Care (Signed)
  Problem: Health Behavior/Discharge Planning: Goal: Ability to manage health-related needs will improve Outcome: Adequate for Discharge Note: Received discharge order for patient. Discharge instructions reviewed with patient, including new prescriptions, special diet-avoiding fatty foods, activity, follow-up appointment, wound care, and any special instructions. At this time patient have stated understanding of discharge instructions. Patient with no further questions at this time. Pt stable in no acute distress. PIV removed, intact. Pt off unit in wheelchair. Nursing care complete.

## 2019-12-26 NOTE — Discharge Summary (Signed)
[facility] Discharge Summary   Patient ID: Samantha Durham MRN: [id number] DOB/AGE: 1985/11/14 34 y.o.  Admit date: 12/24/2019 Discharge date: 12/26/2019  Admitting Diagnosis: Acute cholecystitis   Discharge Diagnosis Patient Active Problem List   Diagnosis Date Noted  . Acute calculous cholecystitis 12/25/2019  . Status post primary low transverse cesarean section 01/22/2016  . Large for dates affecting management of mother 01/21/2016    Consultants None  Imaging: US Abdomen Limited  Result Date: 12/24/2019 CLINICAL DATA:  Right upper quadrant pain for 1 day EXAM: ULTRASOUND ABDOMEN LIMITED RIGHT UPPER QUADRANT COMPARISON:  None. FINDINGS: Gallbladder: Multiple layering gallstones are present. There is a positive sonographic Murphy sign. The gallbladder wall thickness measures up to 2.8 Mm. No pericholecystic fluid. Common bile duct: Diameter: 8 mm Liver: No focal lesion identified. Within normal limits in parenchymal echogenicity. Portal vein is patent on color Doppler imaging with normal direction of blood flow towards the liver. Other: None. IMPRESSION: Cholelithiasis with a positive sonographic Murphy sign. No other definite evidence of acute cholecystitis. Electronically Signed   By: Jonna Clark M.D.   On: 12/24/2019 23:01    Procedures Dr. Fredricka Bonine (12/25/2019) - Laparoscopic Cholecystectomy   Hospital Course:  Samantha Durham is a 34 y.o. female who presented to [facility] 6/2 with 3 months of abdominal pain that became more frequent and severe.  Workup concerning for early acute cholecystitis.  Patient was admitted and underwent procedure listed above.  Tolerated procedure well and was transferred to the floor.  Diet was advanced as tolerated.  On POD1, the patient was voiding well, tolerating diet, ambulating well, pain well controlled, vital signs stable, incisions c/d/i and felt stable for discharge home.  Patient will follow up as below and knows to call with questions or  concerns.    I have personally reviewed the patients medication history on the [place] controlled substance database.   Physical Exam: General:  Alert, NAD, pleasant, comfortable Pulm: rate and effort normal Abd:  Soft, ND, nontender, multiple lap incisions C/D/I, +BS  Allergies as of 12/26/2019   No Known Allergies     Medication List    STOP taking these medications   hydrocortisone 2.5 % rectal cream Commonly known as: Anusol-HC     TAKE these medications   acetaminophen 500 MG tablet Commonly known as: TYLENOL Take 2 tablets (1,000 mg total) by mouth every 8 (eight) hours as needed for mild pain. What changed:   when to take this  reasons to take this   ibuprofen 600 MG tablet Commonly known as: ADVIL Take 1 tablet (600 mg total) by mouth every 6 (six) hours as needed for mild pain.   oxyCODONE 5 MG immediate release tablet Commonly known as: Oxy IR/ROXICODONE Take 1 tablet (5 mg total) by mouth every 6 (six) hours as needed for severe pain.   polyethylene glycol 17 g packet Commonly known as: MiraLax Take 17 g by mouth daily as needed for mild constipation. What changed:   when to take this  reasons to take this        Follow-up Information    [facility] Go on 01/15/2020.   Specialty: General Surgery Why: Your appointment is 06/24 at 1:45pm Please arrive 30 minutes prior to your appointment to check in and fill out paperwork. Bring photo ID and insurance information. Contact information: [address] [place] [address] [phone]          Signed: Franne Forts, PA-C [facility] 12/26/2019, 8:06 AM Please see Amion for pager number during day hours 7:00am-4:30pm

## 2021-10-22 ENCOUNTER — Emergency Department (HOSPITAL_BASED_OUTPATIENT_CLINIC_OR_DEPARTMENT_OTHER)
Admission: EM | Admit: 2021-10-22 | Discharge: 2021-10-22 | Disposition: A | Discharge status: Home / Self Care | Payer: Medicaid Other | Attending: Emergency Medicine | Admitting: Emergency Medicine

## 2021-10-22 ENCOUNTER — Encounter (HOSPITAL_BASED_OUTPATIENT_CLINIC_OR_DEPARTMENT_OTHER): Payer: Self-pay | Admitting: Emergency Medicine

## 2021-10-22 ENCOUNTER — Other Ambulatory Visit: Payer: Self-pay

## 2021-10-22 ENCOUNTER — Emergency Department (HOSPITAL_BASED_OUTPATIENT_CLINIC_OR_DEPARTMENT_OTHER): Payer: Medicaid Other

## 2021-10-22 DIAGNOSIS — L03213 Periorbital cellulitis: Secondary | ICD-10-CM | POA: Diagnosis not present

## 2021-10-22 DIAGNOSIS — H5712 Ocular pain, left eye: Secondary | ICD-10-CM | POA: Diagnosis present

## 2021-10-22 LAB — CBC WITH DIFFERENTIAL/PLATELET
Abs Immature Granulocytes: 0.01 10*3/uL (ref 0.00–0.07)
Basophils Absolute: 0 10*3/uL (ref 0.0–0.1)
Basophils Relative: 0 %
Eosinophils Absolute: 0.1 10*3/uL (ref 0.0–0.5)
Eosinophils Relative: 1 %
HCT: 39.7 % (ref 36.0–46.0)
Hemoglobin: 13.4 g/dL (ref 12.0–15.0)
Immature Granulocytes: 0 %
Lymphocytes Relative: 33 %
Lymphs Abs: 3 10*3/uL (ref 0.7–4.0)
MCH: 28.4 pg (ref 26.0–34.0)
MCHC: 33.8 g/dL (ref 30.0–36.0)
MCV: 84.1 fL (ref 80.0–100.0)
Monocytes Absolute: 0.7 10*3/uL (ref 0.1–1.0)
Monocytes Relative: 8 %
Neutro Abs: 5.3 10*3/uL (ref 1.7–7.7)
Neutrophils Relative %: 58 %
Platelets: 233 10*3/uL (ref 150–400)
RBC: 4.72 MIL/uL (ref 3.87–5.11)
RDW: 13.2 % (ref 11.5–15.5)
WBC: 9.1 10*3/uL (ref 4.0–10.5)
nRBC: 0 % (ref 0.0–0.2)

## 2021-10-22 LAB — BASIC METABOLIC PANEL
Anion gap: 9 (ref 5–15)
BUN: 20 mg/dL (ref 6–20)
CO2: 25 mmol/L (ref 22–32)
Calcium: 9.6 mg/dL (ref 8.9–10.3)
Chloride: 104 mmol/L (ref 98–111)
Creatinine, Ser: 0.74 mg/dL (ref 0.44–1.00)
GFR, Estimated: >60 mL/min (ref >60)
Glucose, Bld: 83 mg/dL (ref 70–99)
Potassium: 3.4 mmol/L — ABNORMAL LOW (ref 3.5–5.1)
Sodium: 138 mmol/L (ref 135–145)

## 2021-10-22 MED ORDER — CLINDAMYCIN HCL 300 MG PO CAPS: 300.0000 mg | ORAL_CAPSULE | Freq: Three times a day (TID) | ORAL | 0 refills | Status: AC

## 2021-10-22 MED ORDER — ACETAMINOPHEN 325 MG PO TABS
650.0000 mg | ORAL_TABLET | Freq: Once | ORAL | Status: AC
  Administered 2021-10-22: 650 mg via ORAL
  Filled 2021-10-22: qty 2

## 2021-10-22 MED ORDER — SODIUM CHLORIDE 0.9 % IV BOLUS
1000.0000 mL | Freq: Once | INTRAVENOUS | Status: AC
  Administered 2021-10-22: 1000 mL via INTRAVENOUS

## 2021-10-22 MED ORDER — IOHEXOL 300 MG/ML  SOLN
100.0000 mL | Freq: Once | INTRAMUSCULAR | Status: AC | PRN
  Administered 2021-10-22: 75 mL via INTRAVENOUS

## 2021-10-22 MED ORDER — CLINDAMYCIN HCL 300 MG PO CAPS: 300.0000 mg | ORAL_CAPSULE | Freq: Three times a day (TID) | ORAL | 0 refills | Status: DC

## 2021-10-22 NOTE — Discharge Instructions (Signed)
Follow-up with ophthalmology this week.  Take the antibiotics as written. ? ?Return to the ER if your swelling worsens, you have worsening pain or fevers or any additional concerns. ?

## 2021-10-22 NOTE — ED Triage Notes (Signed)
Pt via pov from home with red, swollen, painful left eye. Pt states she awakened this morning with it swollen shut. Pt reports 2 in her family have had conjunctivitis in recent weeks, but none this serious. Pt alert & oriented, nad noted.  ?

## 2021-10-22 NOTE — ED Provider Notes (Signed)
?[facility] EMERGENCY DEPT ?Provider Note ? ? ?CSN: [id number] ?Arrival date & time: 10/22/21  1726 ? ?  ? ?History ? ?Chief Complaint  ?Patient presents with  ? Eye Pain  ? ? ?Samantha Durham is a 36 y.o. female. ? ?Patient presents with chief complaint of left eye swelling pain and discharge.  She is noticed it since this morning.  She states that it is irritable and painful when she moves her eye.  No associated fevers no vomiting no cough no diarrhea.  No recent trauma to the eye.  No change in vision. ? ? ?  ? ?Home Medications ?Prior to Admission medications   ?Medication Sig Start Date End Date Taking? Authorizing Provider  ?clindamycin (CLEOCIN) 300 MG capsule Take 1 capsule (300 mg total) by mouth 3 (three) times daily for 10 days. 10/22/21 11/01/21 Yes Cheryll Cockayne, MD  ?acetaminophen (TYLENOL) 500 MG tablet Take 2 tablets (1,000 mg total) by mouth every 8 (eight) hours as needed for mild pain. 12/26/19   Meuth, Brooke A, PA-C  ?ibuprofen (ADVIL) 600 MG tablet Take 1 tablet (600 mg total) by mouth every 6 (six) hours as needed for mild pain. 12/26/19   Meuth, Brooke A, PA-C  ?oxyCODONE (OXY IR/ROXICODONE) 5 MG immediate release tablet Take 1 tablet (5 mg total) by mouth every 6 (six) hours as needed for severe pain. 12/26/19   Meuth, Brooke A, PA-C  ?polyethylene glycol (MIRALAX) 17 g packet Take 17 g by mouth daily as needed for mild constipation. 12/26/19   Meuth, Lina Sar, PA-C  ?   ? ?Allergies    ?Penicillins   ? ?Review of Systems   ?Review of Systems  ?Constitutional:  Negative for fever.  ?HENT:  Negative for ear pain.   ?Eyes:  Negative for pain.  ?Respiratory:  Negative for cough.   ?Cardiovascular:  Negative for chest pain.  ?Gastrointestinal:  Negative for abdominal pain.  ?Genitourinary:  Negative for flank pain.  ?Musculoskeletal:  Negative for back pain.  ?Skin:  Negative for rash.  ?Neurological:  Negative for headaches.  ? ?Physical Exam ?Updated Vital Signs ?BP 113/66 (BP Location:  Right Arm)   Pulse 84   Temp 98.5 ?F (36.9 ?C) (Oral)   Resp 20   Ht 5\' 6"  (1.676 m)   Wt 65.8 kg   LMP 10/15/2021 (Exact Date)   SpO2 100%   BMI 23.40 kg/m?  ?Physical Exam ?Constitutional:   ?   General: She is not in acute distress. ?   Appearance: Normal appearance.  ?HENT:  ?   Head: Normocephalic.  ?   Nose: Nose normal.  ?Eyes:  ?   Extraocular Movements: Extraocular movements intact.  ?   Pupils: Pupils are equal, round, and reactive to light.  ?   Comments: Conjunctival in the left eye appears injected.  Moderate amount of chemosis present.  Otherwise extraocular motions intact no hyphema.  Pupils equal reactive bilaterally.  ?Cardiovascular:  ?   Rate and Rhythm: Normal rate.  ?Pulmonary:  ?   Effort: Pulmonary effort is normal.  ?Musculoskeletal:     ?   General: Normal range of motion.  ?   Cervical back: Normal range of motion.  ?Neurological:  ?   General: No focal deficit present.  ?   Mental Status: She is alert. Mental status is at baseline.  ? ? ?ED Results / Procedures / Treatments   ?Labs ?(all labs ordered are listed, but only abnormal results are displayed) ?Labs Reviewed  ?  BASIC METABOLIC PANEL - Abnormal; Notable for the following components:  ?    Result Value  ? Potassium 3.4 (*)   ? All other components within normal limits  ?CULTURE, BLOOD (ROUTINE X 2)  ?CULTURE, BLOOD (ROUTINE X 2)  ?CBC WITH DIFFERENTIAL/PLATELET  ? ? ?EKG ?None ? ?Radiology ?CT Orbits W Contrast ? ?Result Date: 10/22/2021 ?CLINICAL DATA:  Left eye cellulitis EXAM: CT ORBITS WITH CONTRAST TECHNIQUE: Multidetector CT images was performed according to the standard protocol following intravenous contrast administration. RADIATION DOSE REDUCTION: This exam was performed according to the departmental dose-optimization program which includes automated exposure control, adjustment of the mA and/or kV according to patient size and/or use of iterative reconstruction technique. CONTRAST:  59mL OMNIPAQUE IOHEXOL 300 MG/ML   SOLN COMPARISON:  No pertinent prior exam. FINDINGS: Orbits: No orbital mass or evidence of inflammation. Normal appearance of the globes, optic nerve-sheath complexes, extraocular muscles, orbital fat and lacrimal glands. Visible paranasal sinuses: Mucous retention cysts in the right maxillary sinus. Mild mucosal thickening in the ethmoid air cells. Soft tissues: Hyperenhancement and edema in the left periorbital soft tissues. Otherwise negative. Osseous: Negative. Limited intracranial: No acute or significant finding. IMPRESSION: 1. Left periorbital soft tissue inflammatory changes, possibly mild preseptal cellulitis. No evidence of intraorbital inflammation or other abnormality. 2. Unremarkable right orbit. Electronically Signed   By: Wiliam Ke M.D.   On: 10/22/2021 19:50   ? ?Procedures ?Procedures  ? ? ?Medications Ordered in ED ?Medications  ?acetaminophen (TYLENOL) tablet 650 mg (650 mg Oral Given 10/22/21 1821)  ?sodium chloride 0.9 % bolus 1,000 mL (1,000 mLs Intravenous New Bag/Given 10/22/21 1824)  ?iohexol (OMNIPAQUE) 300 MG/ML solution 100 mL (75 mLs Intravenous Contrast Given 10/22/21 1930)  ? ? ?ED Course/ Medical Decision Making/ A&P ?  ?                        ?Medical Decision Making ?Amount and/or Complexity of Data Reviewed ?Labs: ordered. ?Radiology: ordered. ? ?Risk ?OTC drugs. ?Prescription drug management. ? ? ?Patient on monitor, showing cardiac rhythm. ? ?Chart review shows outpatient visit urgent care today. ? ?Work-up includes labs White count normal chemistry normal. ? ?CT orbits show no evidence of orbital cellulitis.  Preseptal cellulitis noted. ? ?Patient given IV clindamycin empirically. ? ?Consultation with ophthalmologist Dr. Jenene Slicker.  To follow-up in their office. ? ? ? ? ? ? ? ?Final Clinical Impression(s) / ED Diagnoses ?Final diagnoses:  ?Preseptal cellulitis  ? ? ?Rx / DC Orders ?ED Discharge Orders   ? ?      Ordered  ?  clindamycin (CLEOCIN) 300 MG capsule  3 times daily       ?  10/22/21 2002  ? ?  ?  ? ?  ? ? ?  ?Cheryll Cockayne, MD ?10/22/21 2002 ? ?

## 2021-10-27 LAB — CULTURE, BLOOD (ROUTINE X 2)
Culture: NO GROWTH
Culture: NO GROWTH
Special Requests: ADEQUATE
Special Requests: ADEQUATE

## 2022-03-12 IMAGING — CT CT ABD-PELV W/ CM
2 of 4 series · 15 of 46 positions shown, 17 images · IV contrast (omnipaque)
Comparison: None.

CLINICAL DATA: Central abdominal pain

EXAM:
CT ABDOMEN AND PELVIS WITH CONTRAST
TECHNIQUE: Multidetector CT imaging of the abdomen and pelvis was performed
using the standard protocol following bolus administration of
intravenous contrast.
CONTRAST:  59mL OMNIPAQUE IOHEXOL 300 MG/ML  SOLN

[Series 3: abdomen 5.0 · axial · 0.62mm/px · z∈[+953,+1373]mm · 12 of 96 slices shown, 14 images]
[im 6/96  soft-tissue]
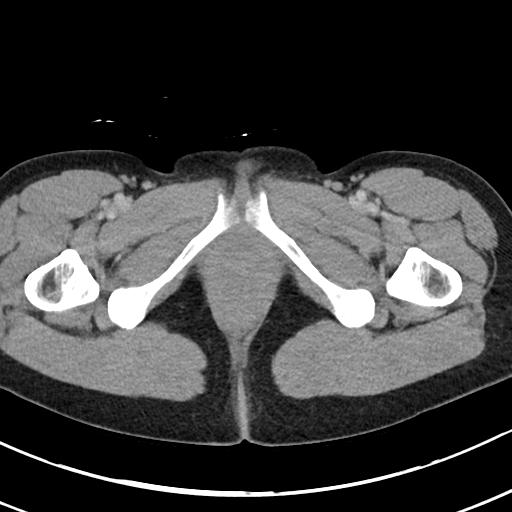
[im 6/96  bone]
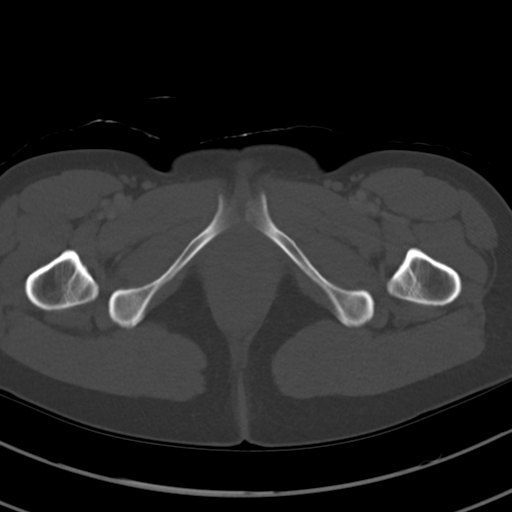
[im 17/96  soft-tissue]
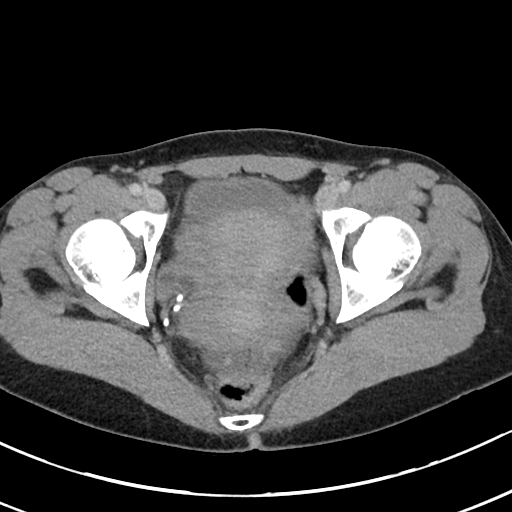
[im 23/96  soft-tissue]
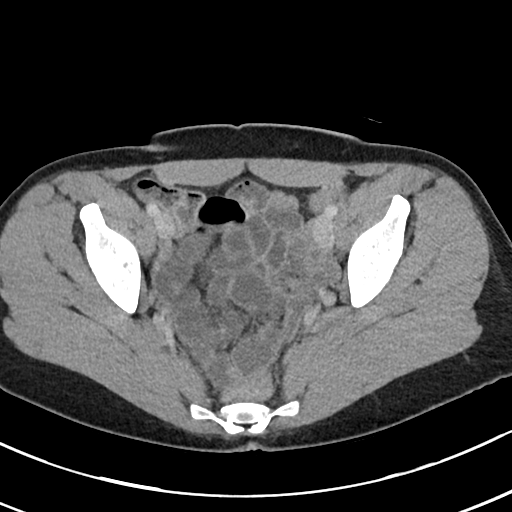
[im 28/96  soft-tissue]
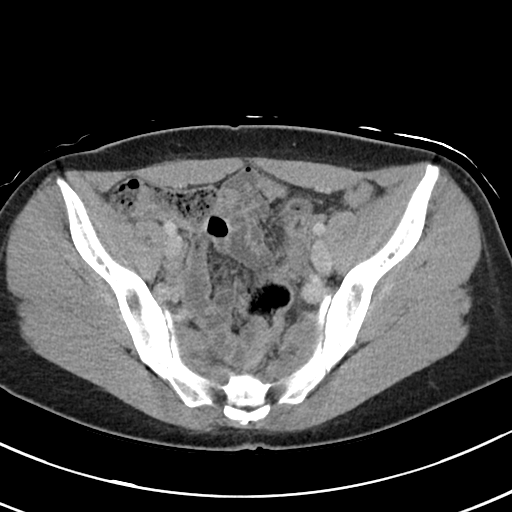
[im 40/96  soft-tissue]
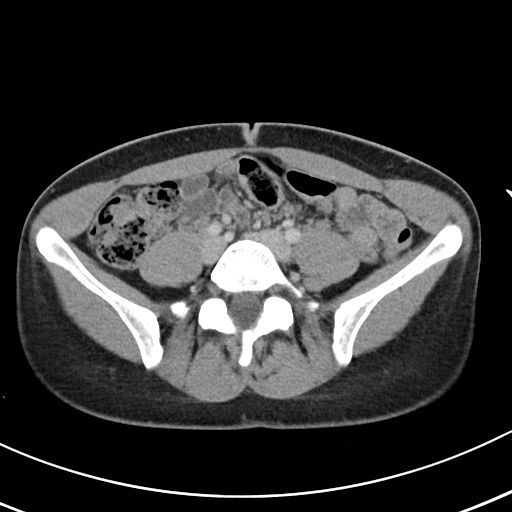
[im 45/96  soft-tissue]
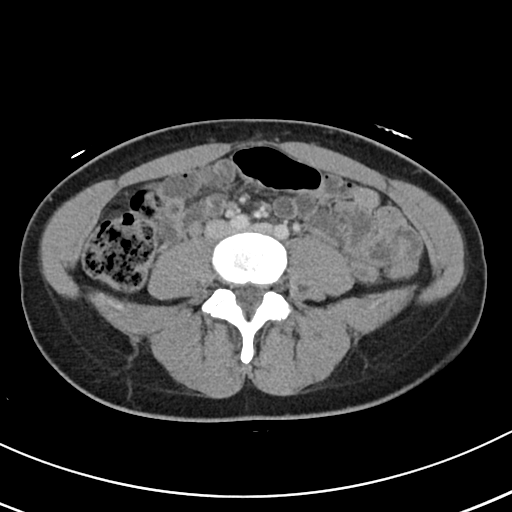
[im 51/96  soft-tissue]
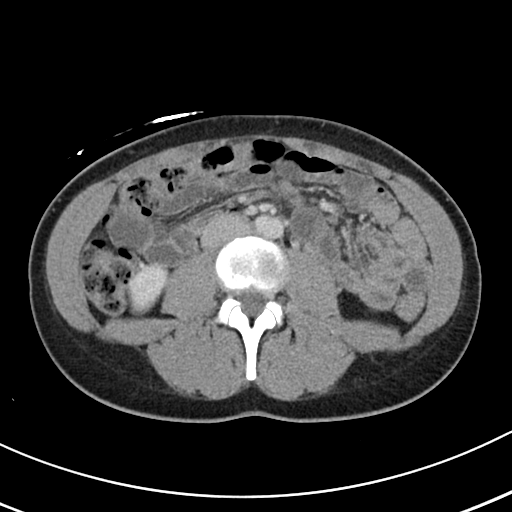
[im 62/96  soft-tissue]
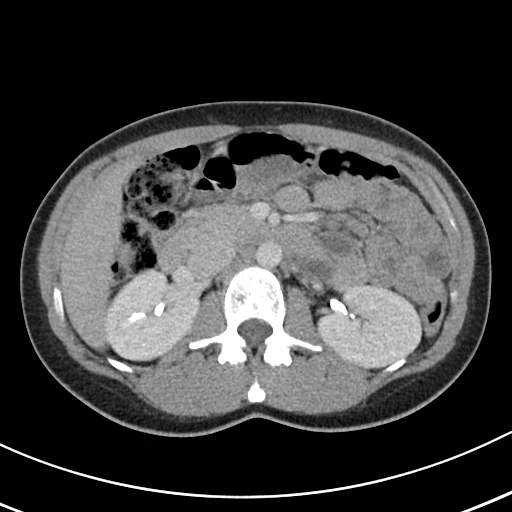
[im 68/96  soft-tissue]
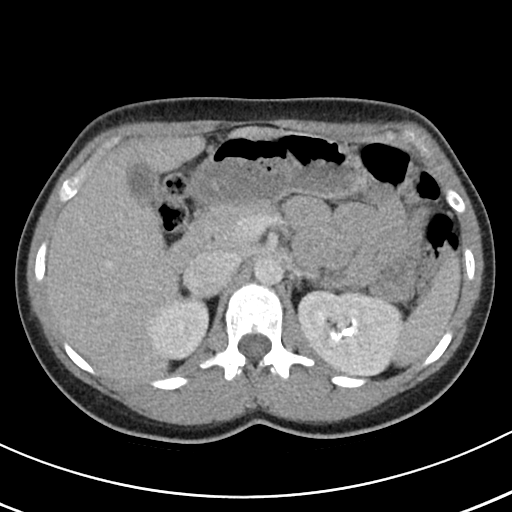
[im 68/96  bone]
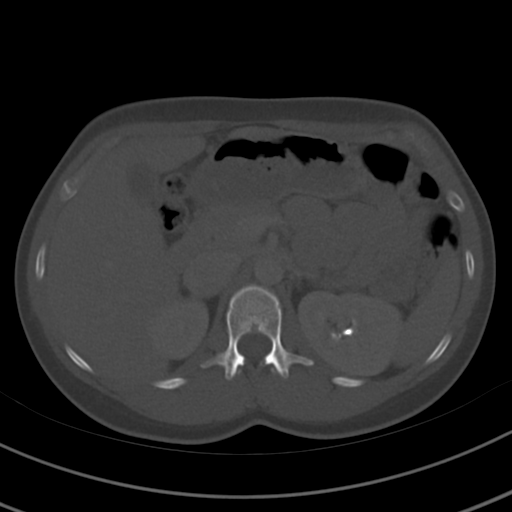
[im 73/96  soft-tissue]
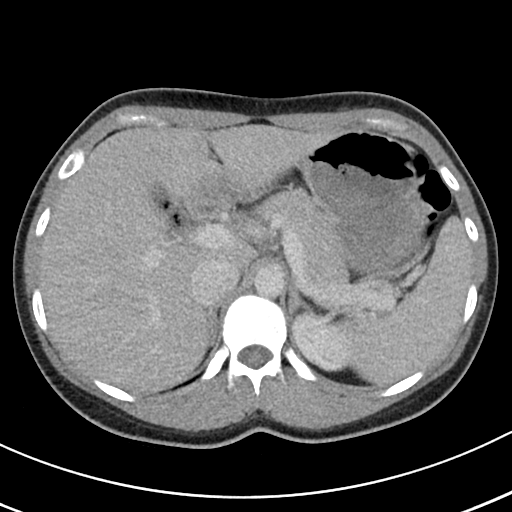
[im 84/96  soft-tissue]
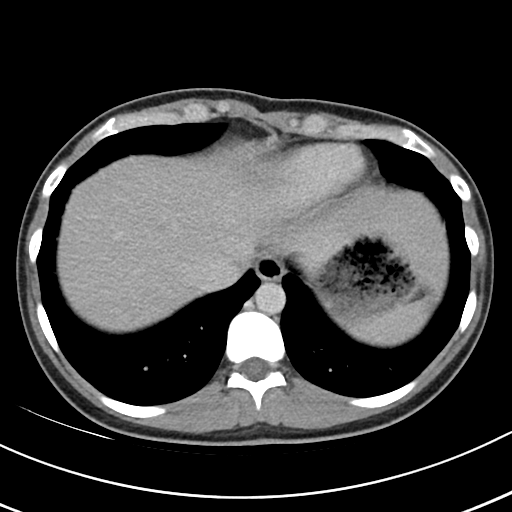
[im 90/96  soft-tissue]
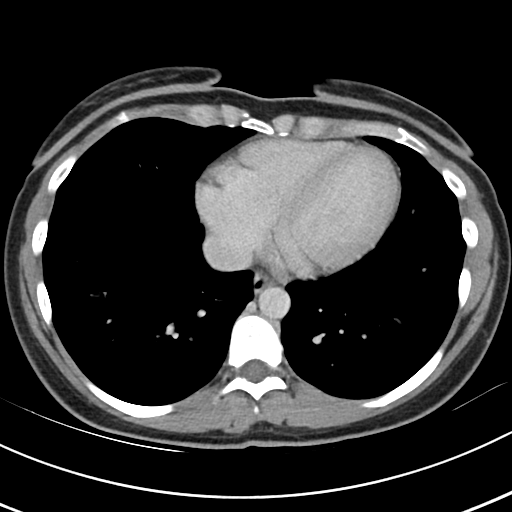

[Series 6: abdomen 3.0 mpr cor · coronal · 0.60mm/px · 3 of 84 slices shown]
[im 28/84  soft-tissue]
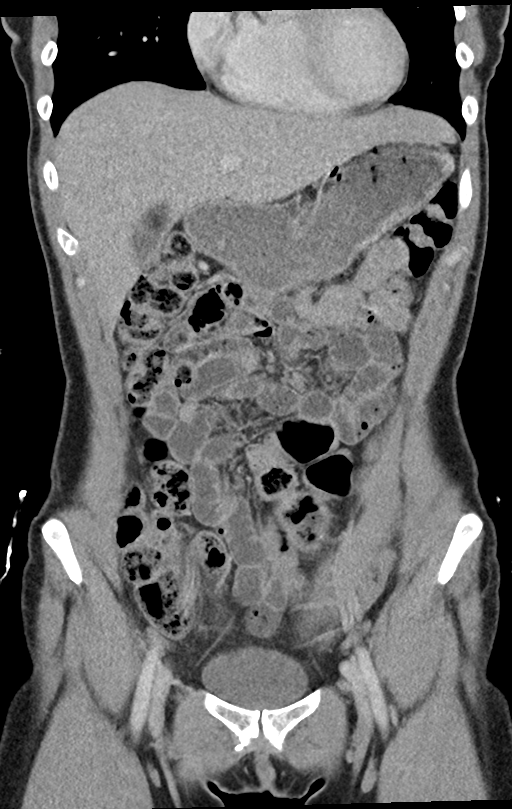
[im 37/84  soft-tissue]
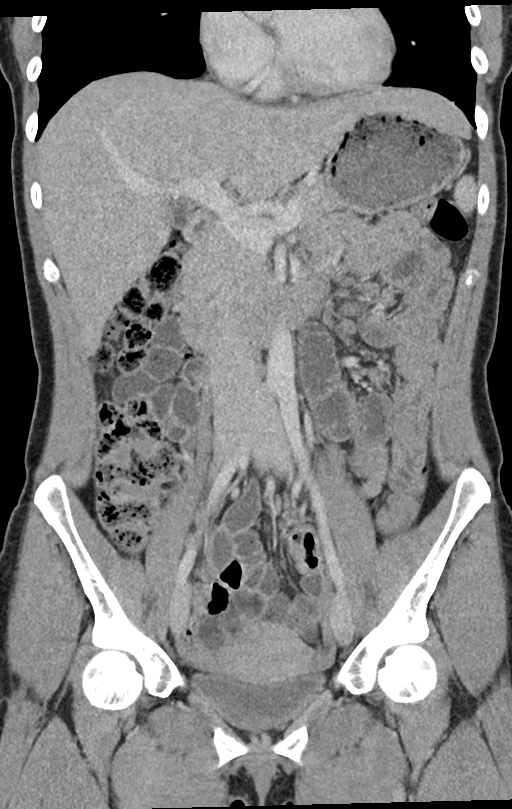
[im 47/84  soft-tissue]
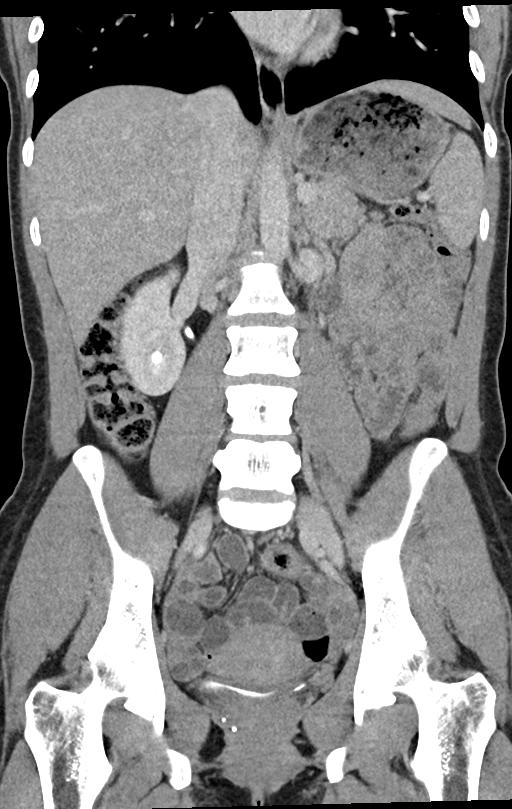

[15 of 46 positions shown; findings below may reference images not displayed]

FINDINGS: Lower chest: No acute abnormality.

Hepatobiliary: Liver is within normal limits. Gallbladder is
partially distended with multiple gallstones within. No biliary
obstructive changes are noted.

Pancreas: Unremarkable. No pancreatic ductal dilatation or
surrounding inflammatory changes.

Spleen: Normal in size without focal abnormality.

Adrenals/Urinary Tract: Adrenal glands are within normal limits.
Kidneys demonstrate a normal enhancement pattern and normal
excretion bilaterally. No obstructive changes are seen. The bladder
is partially distended.

Stomach/Bowel: No obstructive or inflammatory changes of the colon
are seen. The appendix is air-filled and within normal limits. Small
bowel is within normal limits. No gastric abnormality is seen.

Vascular/Lymphatic: No significant vascular findings are present. No
enlarged abdominal or pelvic lymph nodes.

Reproductive: Uterus and bilateral adnexa are unremarkable.

Other: No abdominal wall hernia or abnormality. No abdominopelvic
ascites.

Musculoskeletal: No acute or significant osseous findings.
IMPRESSION: Cholelithiasis without complicating factors.

No other focal abnormality is noted.

## 2024-03-11 ENCOUNTER — Emergency Department (HOSPITAL_BASED_OUTPATIENT_CLINIC_OR_DEPARTMENT_OTHER)
Admission: EM | Admit: 2024-03-11 | Discharge: 2024-03-11 | Disposition: A | Discharge status: Home / Self Care | Payer: Self-pay | Attending: Emergency Medicine | Admitting: Emergency Medicine

## 2024-03-11 ENCOUNTER — Other Ambulatory Visit: Payer: Self-pay

## 2024-03-11 ENCOUNTER — Emergency Department (HOSPITAL_BASED_OUTPATIENT_CLINIC_OR_DEPARTMENT_OTHER): Payer: Self-pay

## 2024-03-11 DIAGNOSIS — R1032 Left lower quadrant pain: Secondary | ICD-10-CM

## 2024-03-11 DIAGNOSIS — K625 Hemorrhage of anus and rectum: Secondary | ICD-10-CM | POA: Insufficient documentation

## 2024-03-11 LAB — COMPREHENSIVE METABOLIC PANEL WITH GFR
ALT: 25 U/L (ref 0–44)
AST: 25 U/L (ref 15–41)
Albumin: 4.4 g/dL (ref 3.5–5.0)
Alkaline Phosphatase: 54 U/L (ref 38–126)
Anion gap: 13 (ref 5–15)
BUN: 18 mg/dL (ref 6–20)
CO2: 21 mmol/L — ABNORMAL LOW (ref 22–32)
Calcium: 9.5 mg/dL (ref 8.9–10.3)
Chloride: 104 mmol/L (ref 98–111)
Creatinine, Ser: 0.66 mg/dL (ref 0.44–1.00)
GFR, Estimated: >60 mL/min (ref >60)
Glucose, Bld: 96 mg/dL (ref 70–99)
Potassium: 3.7 mmol/L (ref 3.5–5.1)
Sodium: 138 mmol/L (ref 135–145)
Total Bilirubin: 0.5 mg/dL (ref 0.0–1.2)
Total Protein: 6.8 g/dL (ref 6.5–8.1)

## 2024-03-11 LAB — URINALYSIS, ROUTINE W REFLEX MICROSCOPIC
Bilirubin Urine: NEGATIVE
Glucose, UA: NEGATIVE mg/dL
Hgb urine dipstick: NEGATIVE
Ketones, ur: NEGATIVE mg/dL
Leukocytes,Ua: NEGATIVE
Nitrite: NEGATIVE
Protein, ur: NEGATIVE mg/dL
Specific Gravity, Urine: 1.021 (ref 1.005–1.030)
pH: 6 (ref 5.0–8.0)

## 2024-03-11 LAB — PREGNANCY, URINE: Preg Test, Ur: NEGATIVE

## 2024-03-11 LAB — CBC
HCT: 38.1 % (ref 36.0–46.0)
Hemoglobin: 12.9 g/dL (ref 12.0–15.0)
MCH: 29.4 pg (ref 26.0–34.0)
MCHC: 33.9 g/dL (ref 30.0–36.0)
MCV: 86.8 fL (ref 80.0–100.0)
Platelets: 225 K/uL (ref 150–400)
RBC: 4.39 MIL/uL (ref 3.87–5.11)
RDW: 12.8 % (ref 11.5–15.5)
WBC: 8.3 K/uL (ref 4.0–10.5)
nRBC: 0 % (ref 0.0–0.2)

## 2024-03-11 LAB — LIPASE, BLOOD: Lipase: 42 U/L (ref 11–51)

## 2024-03-11 NOTE — ED Notes (Signed)
 Patient transported to CT

## 2024-03-11 NOTE — ED Notes (Signed)
 Pt endorses that she can't receive contrast with CT, had reaction, but unsure what type. States had to pull her out of CT and stop contrast. EDP Tegeler notified

## 2024-03-11 NOTE — ED Triage Notes (Signed)
 Patient states abdominal pain with rectal bleeding for 2 weeks. Bright red blood when wiping

## 2024-03-11 NOTE — ED Notes (Signed)
 ED Provider at bedside.

## 2024-03-11 NOTE — Discharge Instructions (Signed)
 Your history, exam, workup today did not reveal evidence of acute diverticulitis or other concerning intra-abdominal pathology that would require admission at this time.  Despite the bleeding, your hemoglobin was normal and the rest of your labs are overall reassuring.  Your urinalysis did not show UTI.  We had a shared decision-making conversation agreed to hold on further pelvic evaluation, rectal examination, and you were unable to make a stool sample to send off for stool studies given your possible exposure to Salmonella and E. coli.  We do recommend follow-up with outpatient gastroenterology to discuss further workup if your symptoms not improve.  If any symptoms change or worsen acutely, please return to the nearest emergency department.

## 2024-03-11 NOTE — ED Provider Notes (Signed)
 [place] EMERGENCY DEPARTMENT AT [facility] Provider Note   CSN: [id number] Arrival date & time: 03/11/24  9062     Patient presents with: Rectal Bleeding   Samantha Durham is a 38 y.o. female.   The history is provided by the patient and medical records. No language interpreter was used.  Rectal Bleeding Quality:  Bright red Amount:  Moderate Duration:  2 weeks Timing:  Intermittent Chronicity:  New Context: diarrhea, hemorrhoids (remote hx but no rectal pain and seems different) and spontaneously   Context: not constipation and not rectal pain   Similar prior episodes: no   Worsened by:  Wiping Ineffective treatments:  None tried Associated symptoms: abdominal pain   Associated symptoms: no fever, no light-headedness, no recent illness and no vomiting   Abdominal pain:    Location:  LLQ   Quality: aching     Severity:  Moderate Risk factors: no anticoagulant use        Prior to Admission medications   Medication Sig Start Date End Date Taking? Authorizing Provider  acetaminophen  (TYLENOL ) 500 MG tablet Take 2 tablets (1,000 mg total) by mouth every 8 (eight) hours as needed for mild pain. 12/26/19   Meuth, Brooke A, PA-C  ibuprofen  (ADVIL ) 600 MG tablet Take 1 tablet (600 mg total) by mouth every 6 (six) hours as needed for mild pain. 12/26/19   Meuth, Brooke A, PA-C  oxyCODONE  (OXY IR/ROXICODONE ) 5 MG immediate release tablet Take 1 tablet (5 mg total) by mouth every 6 (six) hours as needed for severe pain. 12/26/19   Meuth, Brooke A, PA-C  polyethylene glycol (MIRALAX ) 17 g packet Take 17 g by mouth daily as needed for mild constipation. 12/26/19   Meuth, Brooke A, PA-C    Allergies: Penicillins    Review of Systems  Constitutional:  Positive for fatigue. Negative for chills, diaphoresis and fever.  HENT:  Negative for congestion.   Eyes:  Negative for visual disturbance.  Respiratory:  Negative for cough, chest tightness, shortness of breath and wheezing.    Cardiovascular:  Negative for chest pain and palpitations.  Gastrointestinal:  Positive for abdominal distention, abdominal pain, anal bleeding, blood in stool, diarrhea and hematochezia. Negative for constipation, nausea, rectal pain and vomiting.  Genitourinary:  Negative for dysuria and flank pain.  Musculoskeletal:  Negative for back pain, neck pain and neck stiffness.  Skin:  Negative for rash.  Neurological:  Negative for light-headedness and headaches.  Psychiatric/Behavioral:  Negative for agitation.   All other systems reviewed and are negative.   Updated Vital Signs BP 99/63   Pulse 78   Temp 98.5 F (36.9 C) (Oral)   Resp 14   SpO2 100%   Physical Exam Vitals and nursing note reviewed.  Constitutional:      General: She is not in acute distress.    Appearance: She is well-developed. She is not ill-appearing, toxic-appearing or diaphoretic.  HENT:     Head: Normocephalic and atraumatic.     Nose: No congestion or rhinorrhea.     Mouth/Throat:     Mouth: Mucous membranes are moist.  Eyes:     Extraocular Movements: Extraocular movements intact.     Conjunctiva/sclera: Conjunctivae normal.     Pupils: Pupils are equal, round, and reactive to light.  Cardiovascular:     Rate and Rhythm: Normal rate and regular rhythm.     Heart sounds: No murmur heard. Pulmonary:     Effort: Pulmonary effort is normal. No respiratory distress.  Breath sounds: Normal breath sounds. No wheezing, rhonchi or rales.  Chest:     Chest wall: No tenderness.  Abdominal:     General: Abdomen is flat. Bowel sounds are normal.     Palpations: Abdomen is soft.     Tenderness: There is abdominal tenderness in the left lower quadrant. There is no right CVA tenderness, left CVA tenderness, guarding or rebound.   Genitourinary:    Comments: Deferred rectal exam when offered  Musculoskeletal:        General: No swelling or tenderness.     Cervical back: Neck supple. No tenderness.   Skin:    General: Skin is warm and dry.     Capillary Refill: Capillary refill takes less than 2 seconds.     Findings: No erythema or rash.  Neurological:     General: No focal deficit present.     Mental Status: She is alert.  Psychiatric:        Mood and Affect: Mood normal.     (all labs ordered are listed, but only abnormal results are displayed) Labs Reviewed  COMPREHENSIVE METABOLIC PANEL WITH GFR - Abnormal; Notable for the following components:      Result Value   CO2 21 (*)    All other components within normal limits  C DIFFICILE QUICK SCREEN W PCR REFLEX    GASTROINTESTINAL PANEL BY PCR, STOOL (REPLACES STOOL CULTURE)  LIPASE, BLOOD  CBC  URINALYSIS, ROUTINE W REFLEX MICROSCOPIC  PREGNANCY, URINE    EKG: None  Radiology: CT ABDOMEN PELVIS WO CONTRAST Result Date: 03/11/2024 CLINICAL DATA:  Abdominal pain. EXAM: CT ABDOMEN AND PELVIS WITHOUT CONTRAST TECHNIQUE: Multidetector CT imaging of the abdomen and pelvis was performed following the standard protocol without IV contrast. RADIATION DOSE REDUCTION: This exam was performed according to the departmental dose-optimization program which includes automated exposure control, adjustment of the mA and/or kV according to patient size and/or use of iterative reconstruction technique. COMPARISON:  11/17/2019. FINDINGS: Lower chest: No acute abnormality. Hepatobiliary: No suspicious focal hepatic lesion identified within the limits of an unenhanced exam. Status post cholecystectomy. No biliary dilatation. Pancreas: Unremarkable. Spleen: Unremarkable. Adrenals/Urinary Tract: Adrenal glands are unremarkable. No urolithiasis or hydronephrosis. Bladder is unremarkable. Stomach/Bowel: Stomach is within normal limits. Small bowel is grossly unremarkable. No obstruction. Appendix appears normal. Moderate volume of stool throughout the colon. Vascular/Lymphatic: Abdominal aorta is normal in caliber. No enlarged abdominal or pelvic lymph  nodes. Reproductive: Uterus and bilateral adnexa are unremarkable. Other: No abdominopelvic ascites. No intraperitoneal free air. No abdominal wall hernia. Musculoskeletal: No acute or significant osseous findings. IMPRESSION: 1. No acute localizing findings in the abdomen or pelvis. 2. Moderate volume of stool throughout the colon. Electronically Signed   By: Harrietta Sherry M.D.   On: 03/11/2024 12:14     Procedures   Medications Ordered in the ED - No data to display                                  Medical Decision Making Amount and/or Complexity of Data Reviewed Labs: ordered. Radiology: ordered.    Samantha Durham is a 37 y.o. female with a past medical history significant for cholecystectomy who presents with 2 weeks of abdominal pain, bowel changes, loose stools, and rectal bleeding.  According to patient, her children have had Salmonella and E. coli recently and they did have some similar symptoms of abdominal pain with some bloody  diarrhea.  She reports that she has had cramping in her left lower abdomen that has been ongoing on and off for the last 2 weeks that is more early in the mornings.  She reports no burning when she pees and has no urinary symptoms.  She denies constipation.  She reports a remote history of hemorrhoids but has no rectal pain and does not feel them now.  She does not think this is hemorrhoidal.  She reports no trauma.  Denies fevers or chills.  She has had active bowel sounds and feels like there is distention and pressure in her abdomen.  She reports a family history of diverticulitis.  No history of ovarian problems and is not reporting lower pelvic pain. no reported vaginal discharge or vaginal bleeding..    On exam, lungs clear and chest nontender.  Abdomen is tender primarily left lower quadrant.  Left flank and low back nontender.  No rashes or shingles.  Patient otherwise well-appearing.  We offered to do a rectal exam to look for hemorrhoids however she  says she is not having rectal pain and does not think that is what is going on.  Will defer at this time.  We will get a CT ab pelvis to look for diverticulitis as the cause of abdominal pain and some rectal bleeding.  With her family recently having Salmonella and E. coli, will order a stool pathogen panel and C. difficile test.  She does not think she is going to able to have a bowel movement now but if she does we will send it.  If not and imaging is reassuring, anticipate she can follow-up with PCP or GI.  Patient agrees with this plan and does not want pain medicine.  She also does not think this is vaginal or pelvic so we will hold on pelvic ultrasound or pelvic exam currently.  Anticipate reassessment after workup.  2:01 PM Workup returned reassuring.  CT scan does not show evidence of mass or diverticulitis.  No other abnormality on her initial workup.  Her labs did not show anemia.  Urinalysis does not show UTI.  Metabolic panel does not show kidney or liver dysfunction.  She is not pregnant.  Lipase normal.  We had a shared decision-making conversation about management.  She reports that she is unable to make a bowel movement to send off the stool testing.  With her lack of anemia, lack of diverticulitis or masses and otherwise well appearance, patient agrees with plan to discharge home and follow-up with outpatient gastroenterology to discuss her intermittent rectal bleeding and abdominal discomfort.  Patient may need outpatient stool studies and may need evaluation or procedures with GI to further evaluate.  Patient agrees with plan for outpatient follow-up and return precautions.  She no other questions or concerns and will discharge for outpatient follow-up.      Final diagnoses:  Left lower quadrant abdominal pain  Rectal bleeding    ED Discharge Orders     None       Clinical Impression: 1. Left lower quadrant abdominal pain   2. Rectal bleeding     Disposition:  Discharge  Condition: Good  I have discussed the results, Dx and Tx plan with the pt(& family if present). He/she/they expressed understanding and agree(s) with the plan. Discharge instructions discussed at great length. Strict return precautions discussed and pt &/or family have verbalized understanding of the instructions. No further questions at time of discharge.    New Prescriptions  No medications on file    Follow Up: Gastroenterology, [facility] [address] [place] [address] [phone]     [facility] [address] [place]  [id number] [phone]        Odies Desa, Lonni PARAS, MD 03/11/24 [phone]
